# Patient Record
Sex: Female | Born: 1970
Health system: Southern US, Community
[De-identification: ages and names within clinical notes are randomized; demographics above are authoritative.]

## PROBLEM LIST (undated history)

## (undated) DIAGNOSIS — N390 Urinary tract infection, site not specified: Secondary | ICD-10-CM

## (undated) DIAGNOSIS — Z90711 Acquired absence of uterus with remaining cervical stump: Secondary | ICD-10-CM

## (undated) DIAGNOSIS — F419 Anxiety disorder, unspecified: Secondary | ICD-10-CM

## (undated) DIAGNOSIS — F410 Panic disorder [episodic paroxysmal anxiety] without agoraphobia: Secondary | ICD-10-CM

## (undated) HISTORY — PX: ABDOMINAL HYSTERECTOMY: SHX81

---

## 2012-09-11 ENCOUNTER — Emergency Department (HOSPITAL_COMMUNITY): Payer: Self-pay

## 2012-09-11 ENCOUNTER — Encounter (HOSPITAL_COMMUNITY): Payer: Self-pay | Admitting: *Deleted

## 2012-09-11 ENCOUNTER — Emergency Department (HOSPITAL_COMMUNITY)
Admission: EM | Admit: 2012-09-11 | Discharge: 2012-09-11 | Disposition: A | Discharge status: Home / Self Care | Payer: Self-pay | Attending: Emergency Medicine | Admitting: Emergency Medicine

## 2012-09-11 DIAGNOSIS — R000 Tachycardia, unspecified: Secondary | ICD-10-CM | POA: Insufficient documentation

## 2012-09-11 DIAGNOSIS — F411 Generalized anxiety disorder: Secondary | ICD-10-CM | POA: Insufficient documentation

## 2012-09-11 DIAGNOSIS — R110 Nausea: Secondary | ICD-10-CM | POA: Insufficient documentation

## 2012-09-11 DIAGNOSIS — F17200 Nicotine dependence, unspecified, uncomplicated: Secondary | ICD-10-CM | POA: Insufficient documentation

## 2012-09-11 DIAGNOSIS — R0602 Shortness of breath: Secondary | ICD-10-CM | POA: Insufficient documentation

## 2012-09-11 DIAGNOSIS — Z87440 Personal history of urinary (tract) infections: Secondary | ICD-10-CM | POA: Insufficient documentation

## 2012-09-11 DIAGNOSIS — F419 Anxiety disorder, unspecified: Secondary | ICD-10-CM

## 2012-09-11 DIAGNOSIS — R002 Palpitations: Secondary | ICD-10-CM | POA: Insufficient documentation

## 2012-09-11 HISTORY — DX: Urinary tract infection, site not specified: N39.0

## 2012-09-11 LAB — CBC
MCH: 30.2 pg (ref 26.0–34.0)
MCHC: 34.5 g/dL (ref 30.0–36.0)
MCV: 87.5 fL (ref 78.0–100.0)
Platelets: 242 10*3/uL (ref 150–400)
RDW: 13.4 % (ref 11.5–15.5)

## 2012-09-11 LAB — PRO B NATRIURETIC PEPTIDE: Pro B Natriuretic peptide (BNP): 108.2 pg/mL (ref 0–125)

## 2012-09-11 LAB — BASIC METABOLIC PANEL
CO2: 27 mEq/L (ref 19–32)
Calcium: 10 mg/dL (ref 8.4–10.5)
Creatinine, Ser: 0.7 mg/dL (ref 0.50–1.10)
Glucose, Bld: 113 mg/dL — ABNORMAL HIGH (ref 70–99)

## 2012-09-11 MED ORDER — LORAZEPAM 2 MG/ML IJ SOLN
1.0000 mg | INTRAMUSCULAR | Status: DC | PRN
  Administered 2012-09-11: 1 mg via INTRAVENOUS
  Filled 2012-09-11: qty 1

## 2012-09-11 MED ORDER — LORAZEPAM 1 MG PO TABS: 1.0000 mg | ORAL_TABLET | Freq: Three times a day (TID) | ORAL | Status: AC | PRN

## 2012-09-11 MED ORDER — ONDANSETRON HCL 4 MG/2ML IJ SOLN
4.0000 mg | Freq: Once | INTRAMUSCULAR | Status: AC
  Administered 2012-09-11: 4 mg via INTRAVENOUS
  Filled 2012-09-11: qty 2

## 2012-09-11 NOTE — ED Provider Notes (Signed)
CSN: [id number]     Arrival date & time 09/11/12  1231 History     First MD Initiated Contact with Patient 09/11/12 1337     Chief Complaint  Patient presents with  . Chest Pain    HPI   Judith Wu as a tightness in her chest and "like my heart skin or jump out". She states that she lost her mother-in-law to cancer in June. Shuntogram baby born in July. Last week her mother was diagnosed with end-stage lung cancer and is moved in with her. She states her husband all of his siblings are "at each other's throats."  Other stitches under a lot of stress. She looks anxious. She states she feels anxious. She states today she felt nausea she felt tight she felt her heart going fast she felt that it was time to get looked at. No history of cancer no history of DVT she is a smoker history of PE no leg swelling no immobilization casts or fractures surgeries status post hysterectomy not on external hormones no hemoptysis or cough. She is sleeping she is has been eating a little bit less until today states his nausea did not eaten today Past Medical History  Diagnosis Date  . UTI (urinary tract infection)    History reviewed. No pertinent past surgical history. History reviewed. No pertinent family history. History  Substance Use Topics  . Smoking status: Current Every Day Smoker    Types: Cigarettes  . Smokeless tobacco: Not on file  . Alcohol Use: No   OB History   Grav Para Term Preterm Abortions TAB SAB Ect Mult Living                 Review of Systems  Constitutional: Negative for fever, chills, diaphoresis, appetite change and fatigue.  HENT: Negative for sore throat, mouth sores and trouble swallowing.   Eyes: Negative for visual disturbance.  Respiratory: Positive for shortness of breath. Negative for cough, chest tightness and wheezing.   Cardiovascular: Positive for palpitations. Negative for chest pain and leg swelling.  Gastrointestinal: Negative for nausea, vomiting, abdominal  pain, diarrhea and abdominal distention.  Endocrine: Negative for polydipsia, polyphagia and polyuria.  Genitourinary: Negative for dysuria, frequency and hematuria.  Musculoskeletal: Negative for gait problem.  Skin: Negative for color change, pallor and rash.  Neurological: Negative for dizziness, syncope, light-headedness and headaches.  Hematological: Does not bruise/bleed easily.  Psychiatric/Behavioral: Negative for behavioral problems and confusion. The patient is nervous/anxious.     Allergies  Sulfa antibiotics  Home Medications   Current Outpatient Rx  Name  Route  Sig  Dispense  Refill  . ibuprofen (ADVIL,MOTRIN) 200 MG tablet   Oral   Take 600 mg by mouth every 6 (six) hours as needed for pain.         . nitrofurantoin (MACRODANTIN) 50 MG capsule   Oral   Take 50 mg by mouth every other day.         Marland Kitchen LORazepam (ATIVAN) 1 MG tablet   Oral   Take 1 tablet (1 mg total) by mouth 3 (three) times daily as needed for anxiety.   15 tablet   0    BP 120/81  Pulse 72  Temp(Src) 98.6 F (37 C) (Oral)  Resp 17  SpO2 98% Physical Exam  Constitutional: She is oriented to person, place, and time. She appears well-developed and well-nourished. No distress.  Appears anxious  HENT:  Head: Normocephalic.  Eyes: Conjunctivae are normal. Pupils are equal, round,  and reactive to light. No scleral icterus.  Neck: Normal range of motion. Neck supple. No thyromegaly present.  No thyromegaly.  Cardiovascular: Normal rate and regular rhythm.  Exam reveals no gallop and no friction rub.   No murmur heard. Tachycardic arrest no murmurs rubs gallops  Pulmonary/Chest: Effort normal and breath sounds normal. No respiratory distress. She has no wheezes. She has no rales.  Normal pulmonary exam  Abdominal: Soft. Bowel sounds are normal. She exhibits no distension. There is no tenderness. There is no rebound.  Musculoskeletal: Normal range of motion.  Neurological: She is alert and  oriented to person, place, and time.  Skin: Skin is warm and dry. No rash noted.  Psychiatric: She has a normal mood and affect. Her behavior is normal.  Appears anxious but otherwise in    ED Course  EKG: Indication for chest pain findings sinus tachycardia rate 1:15 degree T waves inferiorly no ST changes no ectopy  Procedures (including critical care time)  Labs Reviewed  BASIC METABOLIC PANEL - Abnormal; Notable for the following:    Potassium 2.9 (*)    Glucose, Bld 113 (*)    BUN 4 (*)    All other components within normal limits  CBC - Abnormal; Notable for the following:    Hemoglobin 15.2 (*)    All other components within normal limits  PRO B NATRIURETIC PEPTIDE  D-DIMER, QUANTITATIVE  TSH  POCT I-STAT TROPONIN I  POCT I-STAT TROPONIN I   Dg Chest 1 View  09/11/2012   *RADIOLOGY REPORT*  Clinical Data: Chest pain  CHEST - 1 VIEW  Comparison:  Study obtained earlier in the day.  Findings:  Monitor leads have been removed.  There is underlying emphysema.  No nodular lesions are seen on this study.  There is no edema or consolidation.  The heart size is normal.  Pulmonary vascularity reflects underlying emphysema.  No adenopathy.  No bone lesions.  IMPRESSION: Underlying emphysema.  No mass.  No edema or consolidation.   Original Report Authenticated By: Bretta Bang, M.D.   Dg Chest 2 View  09/11/2012   *RADIOLOGY REPORT*  Clinical Data: Chest pain and shortness of breath  CHEST - 2 VIEW  Comparison: None.  Findings: Hyperinflation consistent with COPD.  Heart size and vascular pattern normal.  No infiltrate or effusion.  Rounded low attenuation opacity lateral left upper lobe may represent a snap on the patients gown. This could also be related to an EKG lead. There is a similar findings seen less obviously over the right upper lobe.  IMPRESSION: COPD. Low attenuation opacity over the left upper lobe laterally may relate to an EKG lead or snap on patients gown.  Similar  finding on the right.  Please correlate clinically and consider repeating the study if indicated.   Original Report Authenticated By: Esperanza Heir, M.D.   1. Anxiety   2. Palpitations     MDM  Initial conversation with her I have written initial radiology report. Your previous artifact is distention from EKG lead or her down. I discussed this with her. She was relieved here "additional cancer". Did tell her we look at this repeat chest x-ray to ensure there is no lesion. It's become quite evident to me that her big concern today was that she may have cancer because her mother's recent diagnosis in her symptoms a smoking history. I made her aware of the abnormal parts of her chest x-ray would suggest early COPD. We are repeating her study  now. We'll obtain a d-dimer she is tachycardic and does smoke otherwise she has low well's criteria for pulmonary embolism she is not hypoxemic.  She's feeling much improved after the above. Heart in the 80s. 97% on room air. Initial and follow troponin 3 hours are normal. D-dimer is normal. Chest x-ray shows resolution of the abnormalities after her EKG leads and down removed. She is early COPD changes. She is made aware of these and asked in no uncertain terms to stop smoking.  Claudean Kinds, MD 09/11/12 [phone]

## 2012-09-11 NOTE — ED Notes (Signed)
Patient transported to X-ray 

## 2012-09-11 NOTE — ED Notes (Signed)
Reports having cp, feels like a tight band around her chest. Also having palpitations, sob and nausea. ekg done at triage, HR 115

## 2012-09-11 NOTE — ED Notes (Signed)
Blood drawn from new IV site for add on labs

## 2012-09-13 ENCOUNTER — Encounter (HOSPITAL_COMMUNITY): Payer: Self-pay | Admitting: Nurse Practitioner

## 2012-09-13 ENCOUNTER — Emergency Department (HOSPITAL_COMMUNITY)
Admission: EM | Admit: 2012-09-13 | Discharge: 2012-09-13 | Disposition: A | Discharge status: Home / Self Care | Payer: Self-pay | Attending: Emergency Medicine | Admitting: Emergency Medicine

## 2012-09-13 DIAGNOSIS — Z87440 Personal history of urinary (tract) infections: Secondary | ICD-10-CM | POA: Insufficient documentation

## 2012-09-13 DIAGNOSIS — Z90710 Acquired absence of both cervix and uterus: Secondary | ICD-10-CM | POA: Insufficient documentation

## 2012-09-13 DIAGNOSIS — R05 Cough: Secondary | ICD-10-CM | POA: Insufficient documentation

## 2012-09-13 DIAGNOSIS — R450 Nervousness: Secondary | ICD-10-CM | POA: Insufficient documentation

## 2012-09-13 DIAGNOSIS — R059 Cough, unspecified: Secondary | ICD-10-CM | POA: Insufficient documentation

## 2012-09-13 DIAGNOSIS — F17200 Nicotine dependence, unspecified, uncomplicated: Secondary | ICD-10-CM | POA: Insufficient documentation

## 2012-09-13 DIAGNOSIS — Z79899 Other long term (current) drug therapy: Secondary | ICD-10-CM | POA: Insufficient documentation

## 2012-09-13 DIAGNOSIS — Z3202 Encounter for pregnancy test, result negative: Secondary | ICD-10-CM | POA: Insufficient documentation

## 2012-09-13 DIAGNOSIS — R112 Nausea with vomiting, unspecified: Secondary | ICD-10-CM | POA: Insufficient documentation

## 2012-09-13 HISTORY — DX: Anxiety disorder, unspecified: F41.9

## 2012-09-13 HISTORY — DX: Acquired absence of uterus with remaining cervical stump: Z90.711

## 2012-09-13 LAB — CBC WITH DIFFERENTIAL/PLATELET
Eosinophils Relative: 0 % (ref 0–5)
HCT: 40.6 % (ref 36.0–46.0)
Lymphocytes Relative: 31 % (ref 12–46)
Lymphs Abs: 1.9 10*3/uL (ref 0.7–4.0)
MCV: 87.9 fL (ref 78.0–100.0)
Monocytes Absolute: 0.3 10*3/uL (ref 0.1–1.0)
Platelets: 204 10*3/uL (ref 150–400)
RBC: 4.62 MIL/uL (ref 3.87–5.11)
WBC: 6.3 10*3/uL (ref 4.0–10.5)

## 2012-09-13 LAB — URINALYSIS, ROUTINE W REFLEX MICROSCOPIC
Ketones, ur: 15 mg/dL — AB
Protein, ur: NEGATIVE mg/dL
Urobilinogen, UA: 1 mg/dL (ref 0.0–1.0)

## 2012-09-13 LAB — COMPREHENSIVE METABOLIC PANEL
ALT: 10 U/L (ref 0–35)
CO2: 30 mEq/L (ref 19–32)
Calcium: 9.5 mg/dL (ref 8.4–10.5)
GFR calc Af Amer: >90 mL/min (ref >90)
GFR calc non Af Amer: >90 mL/min (ref >90)
Glucose, Bld: 106 mg/dL — ABNORMAL HIGH (ref 70–99)
Sodium: 141 mEq/L (ref 135–145)
Total Bilirubin: 0.6 mg/dL (ref 0.3–1.2)

## 2012-09-13 LAB — URINE MICROSCOPIC-ADD ON

## 2012-09-13 LAB — POCT PREGNANCY, URINE: Preg Test, Ur: NEGATIVE

## 2012-09-13 MED ORDER — ONDANSETRON 4 MG PO TBDP
8.0000 mg | ORAL_TABLET | Freq: Once | ORAL | Status: AC
  Administered 2012-09-13: 8 mg via ORAL
  Filled 2012-09-13: qty 2

## 2012-09-13 MED ORDER — ONDANSETRON HCL 4 MG PO TABS: 4.0000 mg | ORAL_TABLET | Freq: Three times a day (TID) | ORAL | Status: DC | PRN

## 2012-09-13 MED ORDER — POTASSIUM CHLORIDE CRYS ER 20 MEQ PO TBCR
40.0000 meq | EXTENDED_RELEASE_TABLET | Freq: Once | ORAL | Status: AC
  Administered 2012-09-13: 40 meq via ORAL
  Filled 2012-09-13: qty 2

## 2012-09-13 NOTE — ED Provider Notes (Signed)
Medical screening examination/treatment/procedure(s) were performed by non-physician practitioner and as supervising physician I was immediately available for consultation/collaboration.  Flint Melter, MD 09/13/12 [phone]

## 2012-09-13 NOTE — ED Provider Notes (Signed)
CSN: [id number]     Arrival date & time 09/13/12  1003 History     First MD Initiated Contact with Patient 09/13/12 1027     Chief Complaint  Patient presents with  . Anxiety   (Consider location/radiation/quality/duration/timing/severity/associated sxs/prior Treatment) HPI Comments: Pt p/w nausea and vomiting.  States she has been nauseated for months, every day, and has vomited the past two days.  Emesis is contents of her stomach only.  Denies fevers, abdominal pain, change in bowel habits.  Denies urinary symptoms.  Denies abnormal vaginal bleeding or discharge.  Pt is s/p hysterectomy.  Pt has an unchanged daily smoker's cough.  No CP or SOB.  Pt has been seen recently for anxiety and still feels anxious, states the ativan is not helping.   Patient is a 42 y.o. female presenting with anxiety. The history is provided by the patient.  Anxiety Associated symptoms include coughing, nausea and vomiting. Pertinent negatives include no abdominal pain, chest pain, chills or fever.    Past Medical History  Diagnosis Date  . UTI (urinary tract infection)   . Anxiety    Past Surgical History  Procedure Laterality Date  . Abdominal hysterectomy     History reviewed. No pertinent family history. History  Substance Use Topics  . Smoking status: Current Every Day Smoker    Types: Cigarettes  . Smokeless tobacco: Not on file  . Alcohol Use: No   OB History   Grav Para Term Preterm Abortions TAB SAB Ect Mult Living                 Review of Systems  Constitutional: Negative for fever and chills.  Respiratory: Positive for cough. Negative for shortness of breath.   Cardiovascular: Negative for chest pain.  Gastrointestinal: Positive for nausea and vomiting. Negative for abdominal pain and diarrhea.  Genitourinary: Negative for dysuria, urgency, frequency, vaginal bleeding and vaginal discharge.  Psychiatric/Behavioral: The patient is nervous/anxious.     Allergies  Sulfa  antibiotics  Home Medications   Current Outpatient Rx  Name  Route  Sig  Dispense  Refill  . ibuprofen (ADVIL,MOTRIN) 200 MG tablet   Oral   Take 600 mg by mouth every 6 (six) hours as needed for pain.         Marland Kitchen LORazepam (ATIVAN) 1 MG tablet   Oral   Take 1 tablet (1 mg total) by mouth 3 (three) times daily as needed for anxiety.   15 tablet   0   . nitrofurantoin (MACRODANTIN) 50 MG capsule   Oral   Take 50 mg by mouth every other day.          BP 126/84  Pulse 106  Temp(Src) 98.3 F (36.8 C) (Oral)  Resp 15  Ht 5\' 3"  (1.6 m)  Wt 105 lb (47.628 kg)  BMI 18.6 kg/m2  SpO2 96% Physical Exam  Nursing note and vitals reviewed. Constitutional: She appears well-developed and well-nourished. No distress.  HENT:  Head: Normocephalic and atraumatic.  Neck: Neck supple.  Cardiovascular: Normal rate and regular rhythm.   Pulmonary/Chest: Effort normal and breath sounds normal. No respiratory distress. She has no wheezes. She has no rales.  Abdominal: Soft. She exhibits no distension. There is no tenderness. There is no rebound and no guarding.  Neurological: She is alert.  Skin: She is not diaphoretic.    ED Course   Procedures (including critical care time)  Labs Reviewed  COMPREHENSIVE METABOLIC PANEL - Abnormal; Notable for the following:  Potassium 3.0 (*)    Glucose, Bld 106 (*)    All other components within normal limits  URINALYSIS, ROUTINE W REFLEX MICROSCOPIC - Abnormal; Notable for the following:    Color, Urine AMBER (*)    APPearance CLOUDY (*)    Hgb urine dipstick SMALL (*)    Bilirubin Urine SMALL (*)    Ketones, ur 15 (*)    Leukocytes, UA TRACE (*)    All other components within normal limits  URINE MICROSCOPIC-ADD ON - Abnormal; Notable for the following:    Squamous Epithelial / LPF FEW (*)    Bacteria, UA FEW (*)    All other components within normal limits  CBC WITH DIFFERENTIAL  LIPASE, BLOOD  POCT PREGNANCY, URINE   Dg Chest 1  View  09/11/2012   *RADIOLOGY REPORT*  Clinical Data: Chest pain  CHEST - 1 VIEW  Comparison:  Study obtained earlier in the day.  Findings:  Monitor leads have been removed.  There is underlying emphysema.  No nodular lesions are seen on this study.  There is no edema or consolidation.  The heart size is normal.  Pulmonary vascularity reflects underlying emphysema.  No adenopathy.  No bone lesions.  IMPRESSION: Underlying emphysema.  No mass.  No edema or consolidation.   Original Report Authenticated By: Bretta Bang, M.D.   Dg Chest 2 View  09/11/2012   *RADIOLOGY REPORT*  Clinical Data: Chest pain and shortness of breath  CHEST - 2 VIEW  Comparison: None.  Findings: Hyperinflation consistent with COPD.  Heart size and vascular pattern normal.  No infiltrate or effusion.  Rounded low attenuation opacity lateral left upper lobe may represent a snap on the patients gown. This could also be related to an EKG lead. There is a similar findings seen less obviously over the right upper lobe.  IMPRESSION: COPD. Low attenuation opacity over the left upper lobe laterally may relate to an EKG lead or snap on patients gown.  Similar finding on the right.  Please correlate clinically and consider repeating the study if indicated.   Original Report Authenticated By: Esperanza Heir, M.D.   Filed Vitals:   09/13/12 1314  BP: 115/82  Pulse: 80  Temp:   Resp: 18      1. Nausea and vomiting     MDM  Pt with months of nausea, vomiting x 2 days without abdominal pain.  Pt has significant anxiety currently. Pt does have PCP she can follow up with.  No CP, SOB.  No fevers.  Abdominal exam is unremarkable.  Labs unremarkable.  No vomiting in ED. Pt given zofran in ED and has been tolerating PO fluids.  D/C home with PCP follow up, zofran for nausea.  Discussed all results with patient.  Pt given return precautions.  Pt verbalizes understanding and agrees with plan.      Trixie Dredge, PA-C 09/13/12 1459

## 2012-09-13 NOTE — ED Notes (Signed)
I gave the patient a cup of ice and a ginger-ale. 

## 2012-09-13 NOTE — ED Notes (Signed)
Pt was seen here 2 days ago and diagnosed with anxiety, given Rx for ativan po. Took ativan yesterday for the anxiety "and it made me feel wobbly and nauseated, i couldn't do anything because i felt so bad after i took the medicine." A&Ox4, resp e/u

## 2015-06-11 ENCOUNTER — Encounter (HOSPITAL_COMMUNITY): Payer: Self-pay | Admitting: Emergency Medicine

## 2015-06-11 ENCOUNTER — Emergency Department (HOSPITAL_COMMUNITY): Payer: Self-pay

## 2015-06-11 ENCOUNTER — Emergency Department (HOSPITAL_COMMUNITY)
Admission: EM | Admit: 2015-06-11 | Discharge: 2015-06-11 | Disposition: A | Discharge status: Home / Self Care | Payer: Self-pay | Attending: Emergency Medicine | Admitting: Emergency Medicine

## 2015-06-11 DIAGNOSIS — R5383 Other fatigue: Secondary | ICD-10-CM | POA: Insufficient documentation

## 2015-06-11 DIAGNOSIS — R630 Anorexia: Secondary | ICD-10-CM | POA: Insufficient documentation

## 2015-06-11 DIAGNOSIS — F17210 Nicotine dependence, cigarettes, uncomplicated: Secondary | ICD-10-CM | POA: Insufficient documentation

## 2015-06-11 DIAGNOSIS — E876 Hypokalemia: Secondary | ICD-10-CM | POA: Insufficient documentation

## 2015-06-11 DIAGNOSIS — M549 Dorsalgia, unspecified: Secondary | ICD-10-CM | POA: Insufficient documentation

## 2015-06-11 DIAGNOSIS — Z90710 Acquired absence of both cervix and uterus: Secondary | ICD-10-CM | POA: Insufficient documentation

## 2015-06-11 DIAGNOSIS — M25511 Pain in right shoulder: Secondary | ICD-10-CM | POA: Insufficient documentation

## 2015-06-11 DIAGNOSIS — F410 Panic disorder [episodic paroxysmal anxiety] without agoraphobia: Secondary | ICD-10-CM | POA: Insufficient documentation

## 2015-06-11 DIAGNOSIS — Z87440 Personal history of urinary (tract) infections: Secondary | ICD-10-CM | POA: Insufficient documentation

## 2015-06-11 HISTORY — DX: Panic disorder (episodic paroxysmal anxiety): F41.0

## 2015-06-11 LAB — I-STAT CHEM 8, ED
BUN: 4 mg/dL — AB (ref 6–20)
CALCIUM ION: 1.11 mmol/L — AB (ref 1.12–1.23)
CHLORIDE: 100 mmol/L — AB (ref 101–111)
CREATININE: 0.6 mg/dL (ref 0.44–1.00)
GLUCOSE: 93 mg/dL (ref 65–99)
HCT: 44 % (ref 36.0–46.0)
Hemoglobin: 15 g/dL (ref 12.0–15.0)
Potassium: 2.6 mmol/L — CL (ref 3.5–5.1)
Sodium: 143 mmol/L (ref 135–145)
TCO2: 26 mmol/L (ref 0–100)

## 2015-06-11 MED ORDER — POTASSIUM CHLORIDE CRYS ER 20 MEQ PO TBCR
40.0000 meq | EXTENDED_RELEASE_TABLET | Freq: Once | ORAL | Status: AC
  Administered 2015-06-11: 40 meq via ORAL
  Filled 2015-06-11: qty 2

## 2015-06-11 NOTE — Discharge Instructions (Signed)
It is important for you to follow-up with your primary care doctor or call the [facility] in order to establish primary care. You did have a slightly low potassium and you were given a potassium supplement in the emergency department. Your chest x-ray did not show any new masses or other concerning findings. It is important for you to stop smoking. Return to ED for any new or worsening symptoms.  Fatigue Fatigue is feeling tired all of the time, a lack of energy, or a lack of motivation. Occasional or mild fatigue is often a normal response to activity or life in general. However, long-lasting (chronic) or extreme fatigue may indicate an underlying medical condition. HOME CARE INSTRUCTIONS  Watch your fatigue for any changes. The following actions may help to lessen any discomfort you are feeling:  Talk to your health care provider about how much sleep you need each night. Try to get the required amount every night.  Take medicines only as directed by your health care provider.  Eat a healthy and nutritious diet. Ask your health care provider if you need help changing your diet.  Drink enough fluid to keep your urine clear or pale yellow.  Practice ways of relaxing, such as yoga, meditation, massage therapy, or acupuncture.  Exercise regularly.   Change situations that cause you stress. Try to keep your work and personal routine reasonable.  Do not abuse illegal drugs.  Limit alcohol intake to no more than 1 drink per day for nonpregnant women and 2 drinks per day for men. One drink equals 12 ounces of beer, 5 ounces of wine, or 1 ounces of hard liquor.  Take a multivitamin, if directed by your health care provider. SEEK MEDICAL CARE IF:   Your fatigue does not get better.  You have a fever.   You have unintentional weight loss or gain.  You have headaches.   You have difficulty:   Falling asleep.  Sleeping throughout the night.  You feel  angry, guilty, anxious, or sad.   You are unable to have a bowel movement (constipation).   You skin is dry.   Your legs or another part of your body is swollen.  SEEK IMMEDIATE MEDICAL CARE IF:   You feel confused.   Your vision is blurry.  You feel faint or pass out.   You have a severe headache.   You have severe abdominal, pelvic, or back pain.   You have chest pain, shortness of breath, or an irregular or fast heartbeat.   You are unable to urinate or you urinate less than normal.   You develop abnormal bleeding, such as bleeding from the rectum, vagina, nose, lungs, or nipples.  You vomit blood.   You have thoughts about harming yourself or committing suicide.   You are worried that you might harm someone else.    This information is not intended to replace advice given to you by your health care provider. Make sure you discuss any questions you have with your health care provider.   Document Released: 11/09/2006 Document Revised: 02/02/2014 Document Reviewed: 05/16/2013 Elsevier Interactive Patient Education [employer]2016 [employer] Inc[employer]

## 2015-06-11 NOTE — ED Notes (Signed)
Pt. reports poor appetite " can't eat" for 2 weeks  , generalized weakness/ fatigue and chronic mid back pain for 6 months denies fall or injury.

## 2015-06-11 NOTE — ED Notes (Signed)
PA Cartner made aware of patient concern for "tight band like feeling" around her upper back between shoulder blades as well as K of 2.6. No recommendations per PA at this time. Pt placed on cardiac monitor for hypokalemia.

## 2015-06-11 NOTE — ED Provider Notes (Signed)
CSN: [id number]     Arrival date & time 06/11/15  1191 History   First MD Initiated Contact with Patient 06/11/15 [phone]     Chief Complaint  Patient presents with  . Weakness  . Anorexia  . Back Pain     (Consider location/radiation/quality/duration/timing/severity/associated sxs/prior Treatment) HPI Judith Wu is a 45 y.o. female with a history of UTI, anxiety and panic attacks comes in for evaluation of multiple complaints including generalized weakness, back pain and loss of appetite. Patient reports her mother died roughly 6 weeks ago and since that time she has had increased fatigue and loss of appetite. States she is still eating and drinking, but doesn't feel like she is eating enough. She also reports ongoing right shoulder and back pain for the past 4 months. States she has tried Ativan as prescribed by her PCP for this problem but it is not helping. She has not followed up with her PCP for this problem. She denies any fevers, chills, chest pain or shortness of breath, abdominal pain, nausea or vomiting, Diaphoresis, urinary symptoms.    Past Medical History  Diagnosis Date  . UTI (urinary tract infection)   . Anxiety   . S/P partial hysterectomy   . Panic attack    Past Surgical History  Procedure Laterality Date  . Abdominal hysterectomy     No family history on file. Social History  Substance Use Topics  . Smoking status: Current Every Day Smoker    Types: Cigarettes  . Smokeless tobacco: None  . Alcohol Use: No   OB History    No data available     Review of Systems A 10 point review of systems was completed and was negative except for pertinent positives and negatives as mentioned in the history of present illness     Allergies  Sulfa antibiotics  Home Medications   Prior to Admission medications   Medication Sig Start Date End Date Taking? Authorizing Provider  ibuprofen (ADVIL,MOTRIN) 200 MG tablet Take 600 mg by mouth every 6 (six) hours as  needed for pain.   Yes Historical Provider, MD  LORazepam (ATIVAN) 1 MG tablet Take 1 tablet (1 mg total) by mouth 3 (three) times daily as needed for anxiety. 09/11/12  Yes Rolland Porter, MD  nitrofurantoin (MACRODANTIN) 50 MG capsule Take 50 mg by mouth every other day.   Yes Historical Provider, MD  ondansetron (ZOFRAN) 4 MG tablet Take 1 tablet (4 mg total) by mouth every 8 (eight) hours as needed for nausea. 09/13/12   Trixie Dredge, PA-C   BP 112/81 mmHg  Pulse 90  Temp(Src) 97.6 F (36.4 C) (Oral)  Resp 20  Ht  (1.6 m)  Wt 48.535 kg  BMI 18.96 kg/m2  SpO2 99% Physical Exam  Constitutional: She is oriented to person, place, and time. She appears well-developed and well-nourished.  HENT:  Head: Normocephalic and atraumatic.  Mouth/Throat: Oropharynx is clear and moist.  Eyes: Conjunctivae are normal. Pupils are equal, round, and reactive to light. Right eye exhibits no discharge. Left eye exhibits no discharge. No scleral icterus.  Neck: Normal range of motion. Neck supple.  Cardiovascular: Normal rate, regular rhythm and normal heart sounds.   Pulmonary/Chest: Effort normal and breath sounds normal. No respiratory distress. She has no wheezes. She has no rales.  Abdominal: Soft. There is no tenderness.  Musculoskeletal: She exhibits no tenderness.  Full active range of motion.  Neurological: She is alert and oriented to person, place, and time.  No cranial nerve deficit. Coordination normal.  Cranial Nerves II-XII grossly intact. Motor strength is 5/5 in all 4 extremities. Sensation intact to light touch. Moves all extremities without ataxia. Gait is baseline.  Skin: Skin is warm and dry. No rash noted.  Psychiatric: She has a normal mood and affect.  Nursing note and vitals reviewed.   ED Course  Procedures (including critical care time) Labs Review Labs Reviewed  I-STAT CHEM 8, ED - Abnormal; Notable for the following:    Potassium 2.6 (*)    Chloride 100 (*)    BUN 4 (*)     Calcium, Ion 1.11 (*)    All other components within normal limits    Imaging Review Dg Chest 2 View  06/11/2015  CLINICAL DATA:  Weakness, back pain, decreased appetite for 3 weeks EXAM: CHEST  2 VIEW COMPARISON:  09/11/2012 FINDINGS: Cardiomediastinal silhouette is stable. Mild hyperinflation. Mild degenerative changes mid thoracic spine. Mild lower thoracic levoscoliosis. IMPRESSION: No active disease. Hyperinflation again noted. Mild degenerative changes thoracic spine. Mild lower thoracic levoscoliosis. Electronically Signed   By: Natasha MeadLiviu  Pop M.D.   On: 06/11/2015 08:12   I have personally reviewed and evaluated these images and lab results as part of my medical decision-making.   EKG Interpretation None     Filed Vitals:   06/11/15 0625 06/11/15 0700 06/11/15 0749  BP: 124/95 112/73 112/81  Pulse: 98 68 90  Temp: 97.6 F (36.4 C)    TempSrc: Oral    Resp: 16  20  Height: 5\' 3"  (1.6 m)    Weight: 48.535 kg    SpO2: 99% 99% 99%    MDM  Judith Wu is a 45 y.o. female who comes in for ongoing fatigue over the past 6 months, chronic back pain. On arrival, patient is hemodynamically stable and afebrile. She overall appears well, nontoxic. Physical exam is grossly unremarkable. We will obtain screening labs to ensure no electrolyte abnormality, anemia. Anticipate discharge and patient may follow-up with PCP.  Patient with hypokalemia at 2.6. It appears that this is baseline for patient and that she maintains a low K. However, given oral repletion in the emergency department. Encouraged aggressive oral rehydration at home with electrolyte balanced solution such as Gatorade. No evidence of any other acute or emergent pathology that requires immediate intervention.  Upon discharge, patient becomes tearful, Patient also concerned that her shoulder pain could be due to lung cancer as this happened to her mother. States she's had 10 pound weight loss over the past 3 weeks. Pending  chest x-ray.  Chest x-ray shows no active disease. Discussed smoking cessation at length. [facility] in order to establish PCP follow-up. She verbalizes understanding and agrees with this plan as well as subsequent discharge.  Final diagnoses:  Other fatigue  Hypokalemia        Joycie PeekBenjamin Lizandra Zakrzewski, PA-C 06/11/15 [id number]  Tomasita CrumbleAdeleke Oni, MD 06/11/15 1256

## 2015-06-11 NOTE — ED Notes (Signed)
Pt c/o weakness, back pain and decreased appetite x 3 weeks. Reports having similar episode in the past in which PCP gave ativan for panic attacks. Pt's symptoms unrelieved with medications at home

## 2015-06-18 ENCOUNTER — Ambulatory Visit: Payer: Self-pay | Attending: Internal Medicine | Admitting: Internal Medicine

## 2015-06-18 ENCOUNTER — Encounter: Payer: Self-pay | Admitting: Internal Medicine

## 2015-06-18 VITALS — BP 98/73 | HR 97 | Temp 98.3°F | Wt 107.0 lb

## 2015-06-18 DIAGNOSIS — Z791 Long term (current) use of non-steroidal anti-inflammatories (NSAID): Secondary | ICD-10-CM | POA: Insufficient documentation

## 2015-06-18 DIAGNOSIS — Z79899 Other long term (current) drug therapy: Secondary | ICD-10-CM | POA: Insufficient documentation

## 2015-06-18 DIAGNOSIS — Z90711 Acquired absence of uterus with remaining cervical stump: Secondary | ICD-10-CM | POA: Insufficient documentation

## 2015-06-18 DIAGNOSIS — Z720 Tobacco use: Secondary | ICD-10-CM

## 2015-06-18 DIAGNOSIS — N390 Urinary tract infection, site not specified: Secondary | ICD-10-CM | POA: Insufficient documentation

## 2015-06-18 DIAGNOSIS — F411 Generalized anxiety disorder: Secondary | ICD-10-CM

## 2015-06-18 DIAGNOSIS — F17210 Nicotine dependence, cigarettes, uncomplicated: Secondary | ICD-10-CM | POA: Insufficient documentation

## 2015-06-18 DIAGNOSIS — E876 Hypokalemia: Secondary | ICD-10-CM

## 2015-06-18 DIAGNOSIS — E46 Unspecified protein-calorie malnutrition: Secondary | ICD-10-CM

## 2015-06-18 DIAGNOSIS — M549 Dorsalgia, unspecified: Secondary | ICD-10-CM | POA: Insufficient documentation

## 2015-06-18 DIAGNOSIS — F32A Depression, unspecified: Secondary | ICD-10-CM

## 2015-06-18 DIAGNOSIS — F410 Panic disorder [episodic paroxysmal anxiety] without agoraphobia: Secondary | ICD-10-CM | POA: Insufficient documentation

## 2015-06-18 DIAGNOSIS — F329 Major depressive disorder, single episode, unspecified: Secondary | ICD-10-CM

## 2015-06-18 DIAGNOSIS — R630 Anorexia: Secondary | ICD-10-CM | POA: Insufficient documentation

## 2015-06-18 LAB — BASIC METABOLIC PANEL WITH GFR
BUN: 6 mg/dL — ABNORMAL LOW (ref 7–25)
CHLORIDE: 101 mmol/L (ref 98–110)
CO2: 29 mmol/L (ref 20–31)
Calcium: 9.7 mg/dL (ref 8.6–10.2)
Creat: 0.66 mg/dL (ref 0.50–1.10)
GFR, Est African American: >89 mL/min (ref >=60)
GLUCOSE: 106 mg/dL — AB (ref 65–99)
Potassium: 3.6 mmol/L (ref 3.5–5.3)
SODIUM: 140 mmol/L (ref 135–146)

## 2015-06-18 LAB — CBC
HEMATOCRIT: 42.4 % (ref 35.0–45.0)
Hemoglobin: 14.2 g/dL (ref 11.7–15.5)
MCH: 30 pg (ref 27.0–33.0)
MCHC: 33.5 g/dL (ref 32.0–36.0)
MCV: 89.6 fL (ref 80.0–100.0)
MPV: 10 fL (ref 7.5–12.5)
Platelets: 261 10*3/uL (ref 140–400)
RBC: 4.73 MIL/uL (ref 3.80–5.10)
RDW: 13.7 % (ref 11.0–15.0)
WBC: 7.2 10*3/uL (ref 3.8–10.8)

## 2015-06-18 LAB — HEMOGLOBIN A1C
Hgb A1c MFr Bld: 5.5 % (ref <5.7)
MEAN PLASMA GLUCOSE: 111 mg/dL

## 2015-06-18 MED ORDER — ESCITALOPRAM OXALATE 20 MG PO TABS: 20.0000 mg | ORAL_TABLET | Freq: Every day | ORAL | Status: DC

## 2015-06-18 MED ORDER — ESCITALOPRAM OXALATE 20 MG PO TABS: 20.0000 mg | ORAL_TABLET | Freq: Every day | ORAL | Status: AC

## 2015-06-18 MED FILL — ?ESCITALOPRAM 20 MG TABLET: 20 | 30 days supply | Qty: 30 | Fill #0

## 2015-06-18 NOTE — Progress Notes (Signed)
Judith Wu, is a 45 y.o. female  ONG:[id number]:[id number]  GMW:[id number]  DOB - [birth year]  CC:  Chief Complaint  Patient presents with  . Follow-up    hypokalemia  . Depression    Mom passed x 6 wks ago       HPI: Judith CorporalCrystal Wu is a 45 y.o. female here today to establish medical care, w/ recent ER visit 5/16 for weakness/anorexia/back pain ongoing for 6months, dx w/ hypokalemia. Pt states she has not been eating well at all last fews months, concentration decreased as well, but sleeping ok. Very tearful and sad all day.  + recently separated from her husband of 17 years 3 weeks ago.   They have 3 children together, youngest is 46 y/o who lives w/ her.  Denies si/ha/avh.   She and her husband went through marriage counseling last week, but she does not know if it helped.  She thinks her husband is paranoid; he thinks she was cheating on him, which she denied.   Was seeing Dr Doristine CounterBurnett, who prescribes her ativan prn and macrobid for frequent hx of UTI.  Pt states she takes  Ativan sparingly.  She has never been placed on antidepresant prior, but very interested in anything that will help her.  She is currently drinking 1 Ensure daily as well as 1 gatorade to help w/ nutrition and electrolytes.  But appetite is very poor, admitted to eating pop tart for lunch.  Prior hx of frequent UTIs, for which doctors thought her uterus was pressing on her bladder and causing this, so she underwent Hysterectomy in 1990s.  However, she still states she is getting utis.  + Heavy tob 2ppd for many years, not ready to quit due to her current stressors. Mom passed away 3 years ago.  Dad passed away years ago.  Patient has No headache, No chest pain, No abdominal pain - No Nausea, No new weakness tingling or numbness, No Cough - SOB.  Allergies  Allergen Reactions  . Sulfa Antibiotics    Past Medical History  Diagnosis Date  . UTI (urinary tract infection)   . Anxiety   . S/P partial hysterectomy   .  Panic attack    Current Outpatient Prescriptions on File Prior to Visit  Medication Sig Dispense Refill  . ibuprofen (ADVIL,MOTRIN) 200 MG tablet Take 600 mg by mouth every 6 (six) hours as needed for pain.    Marland Kitchen. LORazepam (ATIVAN) 1 MG tablet Take 1 tablet (1 mg total) by mouth 3 (three) times daily as needed for anxiety. 15 tablet 0  . nitrofurantoin (MACRODANTIN) 50 MG capsule Take 50 mg by mouth every other day. Reported on 06/18/2015     No current facility-administered medications on file prior to visit.   No family history on file. Social History   Social History  . Marital Status: Married    Spouse Name: N/A  . Number of Children: N/A  . Years of Education: N/A   Occupational History  . Not on file.   Social History Main Topics  . Smoking status: Current Every Day Smoker    Types: Cigarettes  . Smokeless tobacco: Not on file  . Alcohol Use: No  . Drug Use: No  . Sexual Activity: Not on file   Other Topics Concern  . Not on file   Social History Narrative    Review of Systems: Constitutional: Negative for fever, chills, diaphoresis, activity change, + appetite change, decreased and fatigue/no energy x 6months;  HENT: Negative  for ear pain, nosebleeds, congestion, facial swelling, rhinorrhea, neck pain, neck stiffness and ear discharge.  Eyes: Negative for pain, discharge, redness, itching and visual disturbance. Respiratory: Negative for cough, choking, chest tightness, shortness of breath, wheezing and stridor.  Cardiovascular: Negative for chest pain, palpitations and leg swelling. Gastrointestinal: Negative for abdominal distention. Genitourinary: Negative for dysuria, urgency, frequency, hematuria, flank pain, decreased urine volume, difficulty urinating and dyspareunia.   +frequent UTIs Musculoskeletal: Negative for back pain, joint swelling, arthralgia and gait problem. Neurological: Negative for dizziness, tremors, seizures, syncope, facial asymmetry, speech  difficulty, weakness, light-headedness, numbness and headaches.  Hematological: Negative for adenopathy. Does not bruise/bleed easily. Psychiatric/Behavioral: Negative for hallucinations, behavioral problems, confusion, and agitation.    + dysphoric mood, decreased concentration, very depressed/anxious/ tearful all the time, no si/hi/avh   Objective:   Filed Vitals:   06/18/15 1458  BP: 98/73  Pulse: 97  Temp: 98.3 F (36.8 C)    Filed Weights   06/18/15 1458  Weight: 107 lb (48.535 kg)    BP Readings from Last 3 Encounters:  06/18/15 98/73  06/11/15 109/78  09/13/12 115/82    Physical Exam: Constitutional: Patient appears well-developed and well-nourished. AAOx3, very thin appearing, tearful Caucasion female, smelling of tob. HENT: Normocephalic, atraumatic, External right and left ear normal. Oropharynx is clear and moist.  Eyes: Conjunctivae and EOM are normal. PERRL, no scleral icterus. Neck: Normal ROM. Neck supple. No JVD. No tracheal deviation. No thyromegaly. CVS: RRR, S1/S2 +, no murmurs, no gallops, no carotid bruit.  Pulmonary: Effort and breath sounds normal, no stridor, rhonchi, wheezes, rales.  Abdominal: Soft. BS +, no distension or tenderness. Musculoskeletal: Normal range of motion. LE: bilat/ no c/c/e, pulses 2+ bilateral. Neuro: Alert. muscle tone coordination. No cranial nerve deficit grossly. Skin: Skin is warm and dry. No rash noted. Not diaphoretic. No erythema. No pallor. Psychiatric:  Behavior, judgment, thought content normal.  +very tearful/sad/crying throughout exam.  Lab Results  Component Value Date   WBC 6.3 09/13/2012   HGB 15.0 06/11/2015   HCT 44.0 06/11/2015   MCV 87.9 09/13/2012   PLT 204 09/13/2012   Lab Results  Component Value Date   CREATININE 0.60 06/11/2015   BUN 4* 06/11/2015   NA 143 06/11/2015   K 2.6* 06/11/2015   CL 100* 06/11/2015   CO2 30 09/13/2012    No results found for: HGBA1C Lipid Panel  No results  found for: CHOL, TRIG, HDL, CHOLHDL, VLDL, LDLCALC     Depression screen [facility] 2/9 06/18/2015  Decreased Interest 3  Down, Depressed, Hopeless 3  PHQ - 2 Score 6  Altered sleeping 3  Tired, decreased energy 3  Change in appetite 3  Feeling bad or failure about yourself  3  Trouble concentrating 3  Moving slowly or fidgety/restless 3  Suicidal thoughts 0  PHQ-9 Score 24  Difficult doing work/chores Extremely dIfficult    Assessment and plan:   1. Hypokalemia Suspect due to malnutrition/anorexia Labs today Coupons for Boost given as well.  2. Anxiety state - no si/hi/avh - trial lexapro 20mg  po qday; per pt gets Ativan from Dr. Doristine Counter (sp?) - BASIC METABOLIC PANEL WITH GFR - CBC - chk tsh  3. Depression w/ anorexia - trial lexapro, f/u in 2 wks - amb ref psychiatry as well, but pt uninsured. - gave pt list of free Psychiatric clinics in area including [facility] to look into.  4. Tobacco abuse Recd tob cessation, but pt is not ready due to all above  stressors.  5. Malnutrition (HCC) - suspected - Prealbumin - Hemoglobin A1c  6. Fatigue May be mutifactorial, chk cbc r/o anemia chk tsh Emp trx of anxiety/depression w/ lexapro for now.  Return in about 2 weeks (around 07/02/2015) for anxiety ./deppresion.   The patient was given clear instructions to go to ER or return to medical center if symptoms don't improve, worsen or new problems develop. The patient verbalized understanding. The patient was told to call to get lab results if they haven't heard anything in the next week.      Pete Glatter, MD, MBA/MHA [facility] [place], [place] [phone]   06/18/2015, 4:05 PM

## 2015-06-18 NOTE — Patient Instructions (Signed)
° °Generalized Anxiety Disorder °Generalized anxiety disorder (GAD) is a mental disorder. It interferes with life functions, including relationships, work, and school. °GAD is different from normal anxiety, which everyone experiences at some point in their lives in response to specific life events and activities. Normal anxiety actually helps us prepare for and get through these life events and activities. Normal anxiety goes away after the event or activity is over.  °GAD causes anxiety that is not necessarily related to specific events or activities. It also causes excess anxiety in proportion to specific events or activities. The anxiety associated with GAD is also difficult to control. GAD can vary from mild to severe. People with severe GAD can have intense waves of anxiety with physical symptoms (panic attacks).  °SYMPTOMS °The anxiety and worry associated with GAD are difficult to control. This anxiety and worry are related to many life events and activities and also occur more days than not for 6 months or longer. People with GAD also have three or more of the following symptoms (one or more in children): °· Restlessness.   °· Fatigue. °· Difficulty concentrating.   °· Irritability. °· Muscle tension. °· Difficulty sleeping or unsatisfying sleep. °DIAGNOSIS °GAD is diagnosed through an assessment by your health care provider. Your health care provider will ask you questions about your mood, physical symptoms, and events in your life. Your health care provider may ask you about your medical history and use of alcohol or drugs, including prescription medicines. Your health care provider may also do a physical exam and blood tests. Certain medical conditions and the use of certain substances can cause symptoms similar to those associated with GAD. Your health care provider may refer you to a mental health specialist for further evaluation. °TREATMENT °The following therapies are usually used to treat GAD:   °· Medication. Antidepressant medication usually is prescribed for long-term daily control. Antianxiety medicines may be added in severe cases, especially when panic attacks occur.   °· Talk therapy (psychotherapy). Certain types of talk therapy can be helpful in treating GAD by providing support, education, and guidance. A form of talk therapy called cognitive behavioral therapy can teach you healthy ways to think about and react to daily life events and activities. °· Stress management techniques. These include yoga, meditation, and exercise and can be very helpful when they are practiced regularly. °A mental health specialist can help determine which treatment is best for you. Some people see improvement with one therapy. However, other people require a combination of therapies. °  °This information is not intended to replace advice given to you by your health care provider. Make sure you discuss any questions you have with your health care provider. °  °Document Released: 05/09/2012 Document Revised: 02/02/2014 Document Reviewed: 05/09/2012 °Elsevier Interactive Patient Education ©2016 Elsevier Inc. ° °Major Depressive Disorder °Major depressive disorder is a mental illness. It also may be called clinical depression or unipolar depression. Major depressive disorder usually causes feelings of sadness, hopelessness, or helplessness. Some people with this disorder do not feel particularly sad but lose interest in doing things they used to enjoy (anhedonia). Major depressive disorder also can cause physical symptoms. It can interfere with work, school, relationships, and other normal everyday activities. The disorder varies in severity but is longer lasting and more serious than the sadness we all feel from time to time in our lives. °Major depressive disorder often is triggered by stressful life events or major life changes. Examples of these triggers include divorce, loss of your job or home,   a move, and the  death of a family member or close friend. Sometimes this disorder occurs for no obvious reason at all. People who have family members with major depressive disorder or bipolar disorder are at higher risk for developing this disorder, with or without life stressors. Major depressive disorder can occur at any age. It may occur just once in your life (single episode major depressive disorder). It may occur multiple times (recurrent major depressive disorder). °SYMPTOMS °People with major depressive disorder have either anhedonia or depressed mood on nearly a daily basis for at least 2 weeks or longer. Symptoms of depressed mood include: °· Feelings of sadness (blue or down in the dumps) or emptiness. °· Feelings of hopelessness or helplessness. °· Tearfulness or episodes of crying (may be observed by others). °· Irritability (children and adolescents). °In addition to depressed mood or anhedonia or both, people with this disorder have at least four of the following symptoms: °· Difficulty sleeping or sleeping too much.   °· Significant change (increase or decrease) in appetite or weight.   °· Lack of energy or motivation. °· Feelings of guilt and worthlessness.   °· Difficulty concentrating, remembering, or making decisions. °· Unusually slow movement (psychomotor retardation) or restlessness (as observed by others).   °· Recurrent wishes for death, recurrent thoughts of self-harm (suicide), or a suicide attempt. °People with major depressive disorder commonly have persistent negative thoughts about themselves, other people, and the world. People with severe major depressive disorder may experience distorted beliefs or perceptions about the world (psychotic delusions). They also may see or hear things that are not real (psychotic hallucinations). °DIAGNOSIS °Major depressive disorder is diagnosed through an assessment by your health care provider. Your health care provider will ask about aspects of your daily life,  such as mood, sleep, and appetite, to see if you have the diagnostic symptoms of major depressive disorder. Your health care provider may ask about your medical history and use of alcohol or drugs, including prescription medicines. Your health care provider also may do a physical exam and blood work. This is because certain medical conditions and the use of certain substances can cause major depressive disorder-like symptoms (secondary depression). Your health care provider also may refer you to a mental health specialist for further evaluation and treatment. °TREATMENT °It is important to recognize the symptoms of major depressive disorder and seek treatment. The following treatments can be prescribed for this disorder:   °· Medicine. Antidepressant medicines usually are prescribed. Antidepressant medicines are thought to correct chemical imbalances in the brain that are commonly associated with major depressive disorder. Other types of medicine may be added if the symptoms do not respond to antidepressant medicines alone or if psychotic delusions or hallucinations occur. °· Talk therapy. Talk therapy can be helpful in treating major depressive disorder by providing support, education, and guidance. Certain types of talk therapy also can help with negative thinking (cognitive behavioral therapy) and with relationship issues that trigger this disorder (interpersonal therapy). °A mental health specialist can help determine which treatment is best for you. Most people with major depressive disorder do well with a combination of medicine and talk therapy. Treatments involving electrical stimulation of the brain can be used in situations with extremely severe symptoms or when medicine and talk therapy do not work over time. These treatments include electroconvulsive therapy, transcranial magnetic stimulation, and vagal nerve stimulation. °  °This information is not intended to replace advice given to you by your health  care provider. Make sure you discuss any questions you have   with your health care provider. °  °Document Released: 05/09/2012 Document Revised: 02/02/2014 Document Reviewed: 05/09/2012 °Elsevier Interactive Patient Education ©2016 Elsevier Inc. ° °

## 2015-06-19 ENCOUNTER — Telehealth: Payer: Self-pay | Admitting: Internal Medicine

## 2015-06-19 LAB — PREALBUMIN: Prealbumin: 33 mg/dL (ref 17–34)

## 2015-06-19 LAB — TSH: TSH: 1.58 mIU/L

## 2015-06-19 NOTE — Telephone Encounter (Signed)
Patient took lexapro last night and woke up shaking today..patient states her whole body was shaking....please follow up with patient

## 2015-06-19 NOTE — Telephone Encounter (Signed)
Called pt re: shakes w/ Lexapro.  Said she took the 20mg  dose last night, and ended up w/ diffuse shakes this am, now currently resolved.   Denies sz/los/pain/n/v.  I looked up adverse rx, and "shakes" not, but sz is.  She is very thin, perhaps 20mg  dose too high. Advised her to either stop it all together or cut the dose in half next time she takes it to see how she reacts to it.  She wants to try it Friday at the 1/2 dose to see how she does since she has not wk Saturday.  I cautioned her that if she has any concerns, to stop it immediately and call me / go to ER.

## 2016-03-19 ENCOUNTER — Emergency Department (HOSPITAL_COMMUNITY)
Admission: EM | Admit: 2016-03-19 | Discharge: 2016-03-19 | Disposition: A | Discharge status: Home / Self Care | Payer: Self-pay | Attending: Emergency Medicine | Admitting: Emergency Medicine

## 2016-03-19 ENCOUNTER — Encounter (HOSPITAL_COMMUNITY): Payer: Self-pay | Admitting: Emergency Medicine

## 2016-03-19 DIAGNOSIS — S80862A Insect bite (nonvenomous), left lower leg, initial encounter: Secondary | ICD-10-CM | POA: Insufficient documentation

## 2016-03-19 DIAGNOSIS — Y939 Activity, unspecified: Secondary | ICD-10-CM | POA: Insufficient documentation

## 2016-03-19 DIAGNOSIS — F17210 Nicotine dependence, cigarettes, uncomplicated: Secondary | ICD-10-CM | POA: Insufficient documentation

## 2016-03-19 DIAGNOSIS — Y929 Unspecified place or not applicable: Secondary | ICD-10-CM | POA: Insufficient documentation

## 2016-03-19 DIAGNOSIS — Y999 Unspecified external cause status: Secondary | ICD-10-CM | POA: Insufficient documentation

## 2016-03-19 DIAGNOSIS — Z79899 Other long term (current) drug therapy: Secondary | ICD-10-CM | POA: Insufficient documentation

## 2016-03-19 DIAGNOSIS — W57XXXA Bitten or stung by nonvenomous insect and other nonvenomous arthropods, initial encounter: Secondary | ICD-10-CM | POA: Insufficient documentation

## 2016-03-19 MED ORDER — DOXYCYCLINE HYCLATE 100 MG PO CAPS: 100.0000 mg | ORAL_CAPSULE | Freq: Two times a day (BID) | ORAL | 0 refills | Status: AC

## 2016-03-19 MED FILL — DOXYCYCLINE 100 MG TABLET: 100 | 14 days supply | Qty: 28 | Fill #0

## 2016-03-19 NOTE — ED Triage Notes (Addendum)
Pt presents to ED with tick bite to L calf.  Pt sts she has pain in her entire calf.  There is a 2 cm bullseye rash on back of L calf.  Pt sts she pulled a tiny black tick off last night but is not sure where she picked it up from.  4/10 pain.  Pt sts two coworkers recommended she get it looked at.

## 2016-03-19 NOTE — ED Provider Notes (Signed)
[facility]-EMERGENCY DEPT Provider Note   CSN: [id number] Arrival date & time: 03/19/16  4098     History   Chief Complaint Chief Complaint  Patient presents with  . Insect Bite    HPI Judith Wu is a 46 y.o. female.  Judith Wu is a 46 y.o. Female who presents to the ED complaining of a tick bite yesterday. Patient reports yesterday she noticed a tick to her left calf and she removed it with rubbing alcohol and her fingers. She reports noticing a small raised red area where the tick was located. She reports pain to the area. She denies any fevers, body aches, or other rashes. She has taken nothing for treatment today. She denies fevers, body aches, leg swelling, rashes, numbness, tingling, weakness, abdominal pain, nausea, vomiting or cough.    The history is provided by the patient. No language interpreter was used.    Past Medical History:  Diagnosis Date  . Anxiety   . Panic attack   . S/P partial hysterectomy   . UTI (urinary tract infection)     There are no active problems to display for this patient.   Past Surgical History:  Procedure Laterality Date  . ABDOMINAL HYSTERECTOMY      OB History    No data available       Home Medications    Prior to Admission medications   Medication Sig Start Date End Date Taking? Authorizing Provider  doxycycline (VIBRAMYCIN) 100 MG capsule Take 1 capsule (100 mg total) by mouth 2 (two) times daily. 03/19/16   Everlene Farrier, PA-C  escitalopram (LEXAPRO) 20 MG tablet Take 1 tablet (20 mg total) by mouth daily. 06/18/15   Pete Glatter, MD  ibuprofen (ADVIL,MOTRIN) 200 MG tablet Take 600 mg by mouth every 6 (six) hours as needed for pain.    Historical Provider, MD  LORazepam (ATIVAN) 1 MG tablet Take 1 tablet (1 mg total) by mouth 3 (three) times daily as needed for anxiety. 09/11/12   Rolland Porter, MD  nitrofurantoin (MACRODANTIN) 50 MG capsule Take 50 mg by mouth every other day. Reported on 06/18/2015    Historical  Provider, MD    Family History No family history on file.  Social History Social History  Substance Use Topics  . Smoking status: Current Every Day Smoker    Types: Cigarettes  . Smokeless tobacco: Not on file  . Alcohol use No     Allergies   Sulfa antibiotics   Review of Systems Review of Systems  Constitutional: Negative for chills and fever.  Respiratory: Negative for cough.   Gastrointestinal: Negative for abdominal pain, nausea and vomiting.  Musculoskeletal: Negative for arthralgias and myalgias.  Skin: Positive for wound. Negative for rash.  Neurological: Negative for weakness and numbness.     Physical Exam Updated Vital Signs BP 117/86 (BP Location: Right Arm)   Pulse 81   Temp 98.1 F (36.7 C) (Oral)   Resp 16   SpO2 97%   Physical Exam  Constitutional: She appears well-developed and well-nourished. No distress.  Nontoxic appearing.  HENT:  Head: Normocephalic and atraumatic.  Eyes: Right eye exhibits no discharge. Left eye exhibits no discharge.  Cardiovascular: Normal rate, regular rhythm and intact distal pulses.   Pulmonary/Chest: Effort normal. No respiratory distress.  Musculoskeletal: She exhibits no edema or tenderness.  No calf edema or TTP.   Neurological: She is alert. Coordination normal.  Skin: Skin is warm and dry. Capillary refill takes less than  2 seconds. No rash noted. She is not diaphoretic. No pallor.  1.5 cm raised erythematous lesion noted to her left posterior calf. No surrounding erythema or edema. No target lesions. No vesicles or bulla. No discharge or fluctuance.   Psychiatric: She has a normal mood and affect. Her behavior is normal.  Nursing note and vitals reviewed.    ED Treatments / Results  Labs (all labs ordered are listed, but only abnormal results are displayed) Labs Reviewed - No data to display  EKG  EKG Interpretation None       Radiology No results found.  Procedures Procedures (including  critical care time)  Medications Ordered in ED Medications - No data to display   Initial Impression / Assessment and Plan / ED Course  I have reviewed the triage vital signs and the nursing notes.  Pertinent labs & imaging results that were available during my care of the patient were reviewed by me and considered in my medical decision making (see chart for details).    Patient presents to the emergency room after pulling a tick off of her posterior calf yesterday. On exam she is afebrile nontoxic appearing. She is a 1.5 cm area of erythema and raised lesion to her posterior left calf. No surrounding erythema or edema. No target lesions. She denies any fevers or body aches. Will start the patient on doxycycline. Ibuprofen as needed for any pain. I discussed strict and specific return precautions. I advised the patient to follow-up with their primary care provider this week. I advised the patient to return to the emergency department with new or worsening symptoms or new concerns. The patient verbalized understanding and agreement with plan.     Final Clinical Impressions(s) / ED Diagnoses   Final diagnoses:  Tick bite, initial encounter    New Prescriptions Discharge Medication List as of 03/19/2016  7:25 AM    START taking these medications   Details  doxycycline (VIBRAMYCIN) 100 MG capsule Take 1 capsule (100 mg total) by mouth 2 (two) times daily., Starting Thu 03/19/2016, Print         Everlene FarrierWilliam Karis Emig, PA-C 03/19/16 [id number]    Gilda Creasehristopher J Pollina, MD 03/20/16 [phone]

## 2016-03-19 NOTE — ED Notes (Signed)
Pt states she understands instructions. Home stable with steady gait. 

## 2016-03-19 NOTE — ED Notes (Signed)
PA student in speaking with patient at this time.

## 2018-08-17 ENCOUNTER — Other Ambulatory Visit: Payer: Self-pay | Admitting: Physician Assistant

## 2018-08-17 DIAGNOSIS — Z1231 Encounter for screening mammogram for malignant neoplasm of breast: Secondary | ICD-10-CM

## 2018-08-31 ENCOUNTER — Ambulatory Visit: Payer: Self-pay

## 2020-06-21 ENCOUNTER — Other Ambulatory Visit: Payer: Self-pay | Admitting: Physician Assistant

## 2020-06-21 DIAGNOSIS — Z1231 Encounter for screening mammogram for malignant neoplasm of breast: Secondary | ICD-10-CM

## 2020-06-21 DIAGNOSIS — Z122 Encounter for screening for malignant neoplasm of respiratory organs: Secondary | ICD-10-CM

## 2020-07-08 ENCOUNTER — Ambulatory Visit
Admission: RE | Admit: 2020-07-08 | Discharge: 2020-07-08 | Disposition: A | Discharge status: Home / Self Care | Payer: Managed Care, Other (non HMO) | Source: Ambulatory Visit | Attending: Physician Assistant | Admitting: Physician Assistant

## 2020-07-08 DIAGNOSIS — Z122 Encounter for screening for malignant neoplasm of respiratory organs: Secondary | ICD-10-CM

## 2020-08-19 ENCOUNTER — Ambulatory Visit
Admission: RE | Admit: 2020-08-19 | Discharge: 2020-08-19 | Disposition: A | Discharge status: Home / Self Care | Payer: Managed Care, Other (non HMO) | Source: Ambulatory Visit | Attending: Physician Assistant | Admitting: Physician Assistant

## 2020-08-19 ENCOUNTER — Other Ambulatory Visit: Payer: Self-pay

## 2020-08-19 DIAGNOSIS — Z1231 Encounter for screening mammogram for malignant neoplasm of breast: Secondary | ICD-10-CM

## 2020-08-21 ENCOUNTER — Other Ambulatory Visit: Payer: Self-pay | Admitting: Physician Assistant

## 2020-08-21 DIAGNOSIS — R928 Other abnormal and inconclusive findings on diagnostic imaging of breast: Secondary | ICD-10-CM

## 2020-09-23 ENCOUNTER — Other Ambulatory Visit: Payer: Self-pay

## 2020-09-23 ENCOUNTER — Ambulatory Visit
Admission: RE | Admit: 2020-09-23 | Discharge: 2020-09-23 | Disposition: A | Discharge status: Home / Self Care | Payer: No Typology Code available for payment source | Source: Ambulatory Visit | Attending: Physician Assistant | Admitting: Physician Assistant

## 2020-09-23 DIAGNOSIS — R928 Other abnormal and inconclusive findings on diagnostic imaging of breast: Secondary | ICD-10-CM

## 2021-02-14 ENCOUNTER — Emergency Department (HOSPITAL_COMMUNITY)
Admission: EM | Admit: 2021-02-14 | Discharge: 2021-02-15 | Disposition: A | Discharge status: Home / Self Care | Payer: No Typology Code available for payment source | Attending: Emergency Medicine | Admitting: Emergency Medicine

## 2021-02-14 ENCOUNTER — Other Ambulatory Visit: Payer: Self-pay

## 2021-02-14 ENCOUNTER — Emergency Department (HOSPITAL_COMMUNITY): Payer: No Typology Code available for payment source

## 2021-02-14 ENCOUNTER — Encounter (HOSPITAL_COMMUNITY): Payer: Self-pay | Admitting: Emergency Medicine

## 2021-02-14 DIAGNOSIS — J449 Chronic obstructive pulmonary disease, unspecified: Secondary | ICD-10-CM | POA: Insufficient documentation

## 2021-02-14 DIAGNOSIS — Z8616 Personal history of COVID-19: Secondary | ICD-10-CM | POA: Insufficient documentation

## 2021-02-14 DIAGNOSIS — R0602 Shortness of breath: Secondary | ICD-10-CM

## 2021-02-14 DIAGNOSIS — F17200 Nicotine dependence, unspecified, uncomplicated: Secondary | ICD-10-CM | POA: Insufficient documentation

## 2021-02-14 NOTE — ED Triage Notes (Signed)
Patient reports SOB with lighthededness and occasional dry cough onset last week .

## 2021-02-14 NOTE — ED Provider Triage Note (Signed)
Emergency Medicine Provider Triage Evaluation Note  Niasha A Litchford , a 51 y.o. female  was evaluated in triage.  Pt complains of feeling like she can't get a deep breath  Review of Systems  Positive: cough Negative: fever  Physical Exam  BP 139/89 (BP Location: Right Arm)    Pulse 86    Temp 98.2 F (36.8 C) (Oral)    Resp 15    Ht 5\' 3"  (1.6 m)    Wt 55 kg    SpO2 96%    BMI 21.48 kg/m  Gen:   Awake, no distress   Resp:  Normal effort  MSK:   Moves extremities without difficulty  Other:    Medical Decision Making  Medically screening exam initiated at 9:37 PM.  Appropriate orders placed.  Memori A Thomure was informed that the remainder of the evaluation will be completed by another provider, this initial triage assessment does not replace that evaluation, and the importance of remaining in the ED until their evaluation is complete.     , Elson Areas 02/14/21 [id number]

## 2021-02-15 MED ORDER — ALBUTEROL SULFATE HFA 108 (90 BASE) MCG/ACT IN AERS: 1.0000 | INHALATION_SPRAY | Freq: Four times a day (QID) | RESPIRATORY_TRACT | 1 refills | Status: AC | PRN

## 2021-02-15 NOTE — ED Provider Notes (Signed)
[facility] EMERGENCY DEPARTMENT Provider Note   CSN: [id number] Arrival date & time: 02/14/21  2107     History  Chief Complaint  Patient presents with   Shortness of Breath    Judith Wu is a 51 y.o. female Patient with history of COPD, tobacco use, recent COVID infection last week who presents with feeling of shortness of breath x1 night.  Patient reports that she was sitting on the couch when she felt like she was getting enough air, she has had occasional headaches.  Patient also concerned about possible apneic episode because she woke up from sleep with a headache, feeling like she was not getting enough air.  Patient does not have an inhaler, is still actively smoking tobacco.  Patient denies ongoing fever, does endorse a little bit of nausea versus lack of appetite secondary to loss of taste and smell.  Denies chest pain.    Shortness of Breath     Home Medications Prior to Admission medications   Medication Sig Start Date End Date Taking? Authorizing Provider  albuterol (VENTOLIN HFA) 108 (90 Base) MCG/ACT inhaler Inhale 1-2 puffs into the lungs every 6 (six) hours as needed for wheezing or shortness of breath. 02/15/21  Yes Quatisha Zylka H, PA-C  doxycycline (VIBRAMYCIN) 100 MG capsule Take 1 capsule (100 mg total) by mouth 2 (two) times daily. 03/19/16   Everlene Farrier, PA-C  escitalopram (LEXAPRO) 20 MG tablet Take 1 tablet (20 mg total) by mouth daily. 06/18/15   Pete Glatter, MD  ibuprofen (ADVIL,MOTRIN) 200 MG tablet Take 600 mg by mouth every 6 (six) hours as needed for pain.    [provider]  LORazepam (ATIVAN) 1 MG tablet Take 1 tablet (1 mg total) by mouth 3 (three) times daily as needed for anxiety. 09/11/12   Rolland Porter, MD  nitrofurantoin (MACRODANTIN) 50 MG capsule Take 50 mg by mouth every other day. Reported on 06/18/2015    [provider]      Allergies    Sulfa antibiotics    Review of Systems    Review of Systems  Respiratory:  Positive for shortness of breath.   All other systems reviewed and are negative.  Physical Exam Updated Vital Signs BP 125/83    Pulse 63    Temp 97.8 F (36.6 C) (Oral)    Resp 18    Ht 5\' 3"  (1.6 m)    Wt 55 kg    SpO2 96%    BMI 21.48 kg/m  Physical Exam Vitals and nursing note reviewed.  Constitutional:      General: She is not in acute distress.    Appearance: Normal appearance.  HENT:     Head: Normocephalic and atraumatic.  Eyes:     General:        Right eye: No discharge.        Left eye: No discharge.  Cardiovascular:     Rate and Rhythm: Normal rate and regular rhythm.     Heart sounds: No murmur heard.   No friction rub. No gallop.  Pulmonary:     Effort: Pulmonary effort is normal.     Breath sounds: Normal breath sounds.     Comments: Minimal end expiratory wheezing on forced exhale Abdominal:     General: Bowel sounds are normal.     Palpations: Abdomen is soft.  Musculoskeletal:     Right lower leg: No tenderness. No edema.     Left lower leg: No  tenderness. No edema.  Skin:    General: Skin is warm and dry.     Capillary Refill: Capillary refill takes less than 2 seconds.  Neurological:     Mental Status: She is alert and oriented to person, place, and time.  Psychiatric:        Mood and Affect: Mood normal.        Behavior: Behavior normal.    ED Results / Procedures / Treatments   Labs (all labs ordered are listed, but only abnormal results are displayed) Labs Reviewed - No data to display  EKG None  Radiology DG Chest 2 View  Result Date: 02/14/2021 CLINICAL DATA:  Shortness of breath, cough EXAM: CHEST - 2 VIEW COMPARISON:  06/11/2015 FINDINGS: There is hyperinflation of the lungs compatible with COPD. Heart and mediastinal contours are within normal limits. No focal opacities or effusions. No acute bony abnormality. IMPRESSION: COPD.  No active disease. Electronically Signed   By: Charlett Nose M.D.   On:  02/14/2021 22:14    Procedures Procedures    Medications Ordered in ED Medications - No data to display  ED Course/ Medical Decision Making/ A&P                            Medical Decision Making Amount and/or Complexity of Data Reviewed Radiology: ordered.  Risk Prescription drug management.   This is an overall well-appearing patient who reports some shortness of breath, lightheadedness, occasional dry cough since last week.  Patient recently with COVID diagnosis, still getting over some of her infectious symptoms.  She is denying any active fever, chills, does endorse minimal nausea, lack of appetite, no vomiting, no diarrhea.  No chest pain at this time.  My differential diagnosis includes sequelae secondary to COVID, COPD exacerbation, acute bronchitis, pneumonia versus other.  This is not an exhaustive differential.  Minimal clinical concern for pulmonary embolism at this time as only risk factor is hypercoagulable state secondary to COVID infection, tobacco use, however patient has no tachycardia, no evidence of DVT.  Minimal end expiratory wheezing on physical exam.  Normal heart sounds, good air movement throughout.  No signs of cyanosis, clubbing.  I personally reviewed the chest x-ray which shows COPD changes, no other acute intrathoracic abnormality.  I agree with the radiologist interpretation.  Overall well-appearing patient with minimal end expiratory wheezing, history of COPD.  Believe the patient would benefit from having a rescue inhaler at this time.  She is maintaining stable vitals, stable oxygenation saturation, good air movement through both lungs throughout the duration of her visit.  Additional history obtained from her partner.  Encouraged follow-up with PCP.  Patient understands and agrees to plan, discharged in stable condition at this time. Final Clinical Impression(s) / ED Diagnoses Final diagnoses:  Shortness of breath    Rx / DC Orders ED Discharge  Orders          Ordered    albuterol (VENTOLIN HFA) 108 (90 Base) MCG/ACT inhaler  Every 6 hours PRN        02/15/21 0744              Letita Prentiss, Harrel Carina, PA-C 02/15/21 0805    Benjiman Core, MD 02/16/21 [phone]

## 2021-02-15 NOTE — Discharge Instructions (Signed)
As we discussed your symptoms today are consistent with post respiratory infection inflammation of your lungs, and COPD changes.  I think that you would benefit from a rescue inhaler.  You can use the rescue inhaler as needed when you feel short of breath, shake to prime, inhale into the lungs, and hold a deep breath, limit use to every 4-6 hours.  I recommend that you follow-up with your primary care to ensure that you are improving.

## 2021-09-24 ENCOUNTER — Other Ambulatory Visit: Payer: Self-pay | Admitting: Physician Assistant

## 2021-09-24 DIAGNOSIS — Z1231 Encounter for screening mammogram for malignant neoplasm of breast: Secondary | ICD-10-CM

## 2021-09-26 ENCOUNTER — Other Ambulatory Visit: Payer: Self-pay | Admitting: Physician Assistant

## 2021-09-26 DIAGNOSIS — Z122 Encounter for screening for malignant neoplasm of respiratory organs: Secondary | ICD-10-CM

## 2021-09-26 DIAGNOSIS — F17200 Nicotine dependence, unspecified, uncomplicated: Secondary | ICD-10-CM

## 2021-09-30 ENCOUNTER — Ambulatory Visit
Admission: RE | Admit: 2021-09-30 | Discharge: 2021-09-30 | Disposition: A | Discharge status: Home / Self Care | Payer: No Typology Code available for payment source | Source: Ambulatory Visit | Attending: Physician Assistant | Admitting: Physician Assistant

## 2021-09-30 DIAGNOSIS — Z1231 Encounter for screening mammogram for malignant neoplasm of breast: Secondary | ICD-10-CM

## 2021-10-23 ENCOUNTER — Ambulatory Visit
Admission: RE | Admit: 2021-10-23 | Discharge: 2021-10-23 | Disposition: A | Discharge status: Home / Self Care | Payer: No Typology Code available for payment source | Source: Ambulatory Visit | Attending: Physician Assistant | Admitting: Physician Assistant

## 2021-10-23 DIAGNOSIS — Z122 Encounter for screening for malignant neoplasm of respiratory organs: Secondary | ICD-10-CM

## 2021-10-23 DIAGNOSIS — F17200 Nicotine dependence, unspecified, uncomplicated: Secondary | ICD-10-CM

## 2022-04-18 IMAGING — US US BREAST*L* LIMITED INC AXILLA
1 series · 5 of 5 positions shown · non-contrast
Comparison: Baseline screening mammogram dated 08/19/2020.

CLINICAL DATA: Possible mass in the medial left breast on a recent
baseline screening mammogram.

EXAM:
DIGITAL DIAGNOSTIC UNILATERAL LEFT MAMMOGRAM WITH TOMOSYNTHESIS AND
CAD; ULTRASOUND LEFT BREAST LIMITED
TECHNIQUE: Left digital diagnostic mammography and breast tomosynthesis was
performed. The images were evaluated with computer-aided detection.;
Targeted ultrasound examination of the left breast was performed.

[Series 1: us breast*left* limited inc axilla · 0.05mm/px · 5 of 5 slices shown]
[im 1/5]
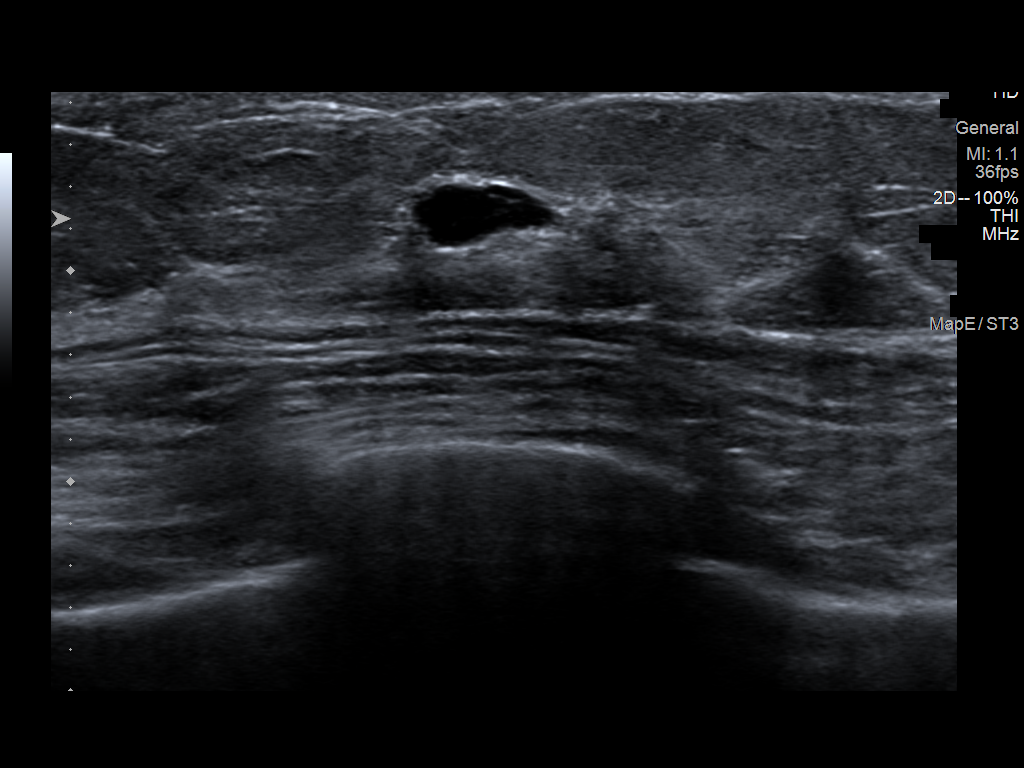
[im 2/5]
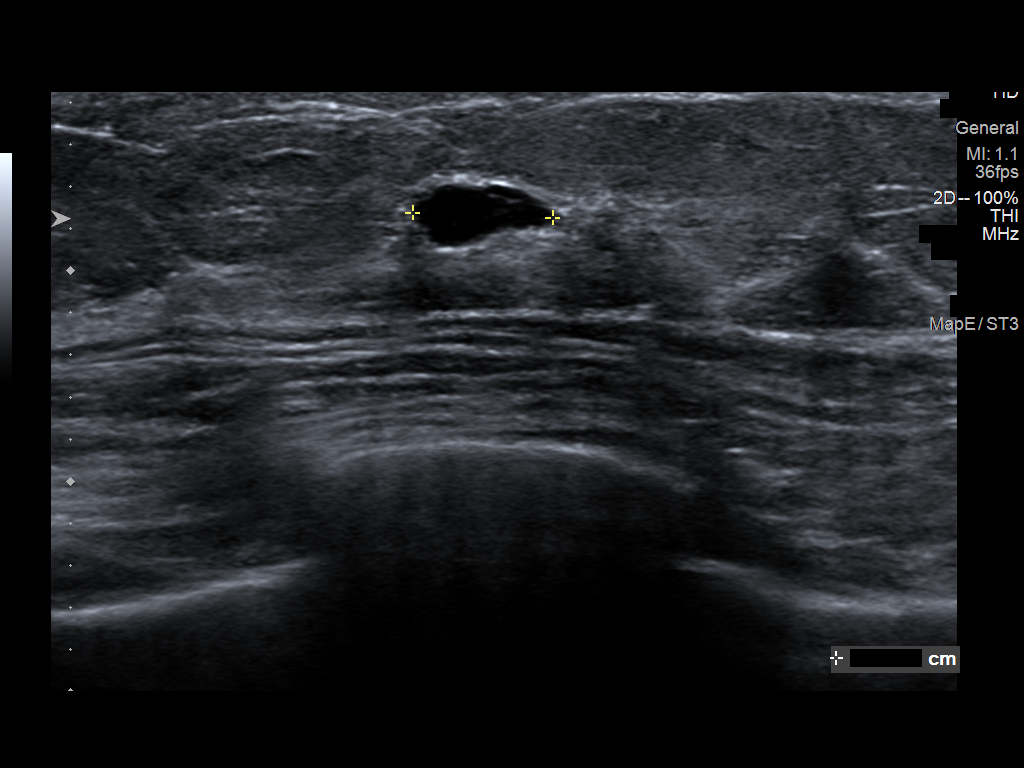
[im 3/5]
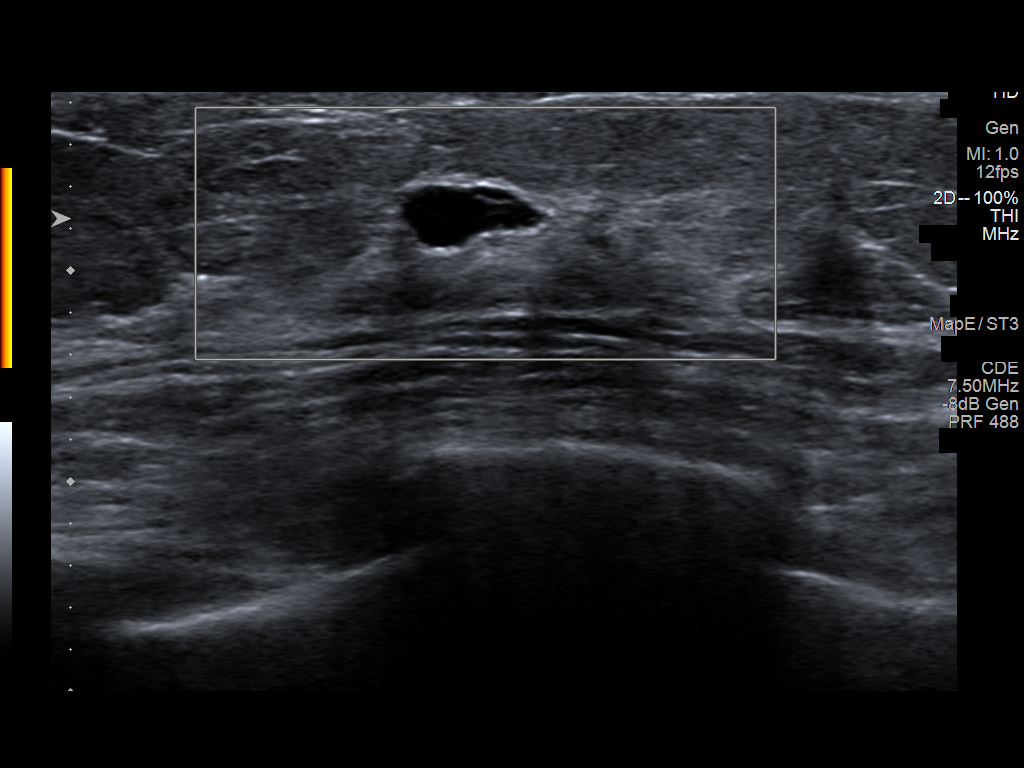
[im 4/5]
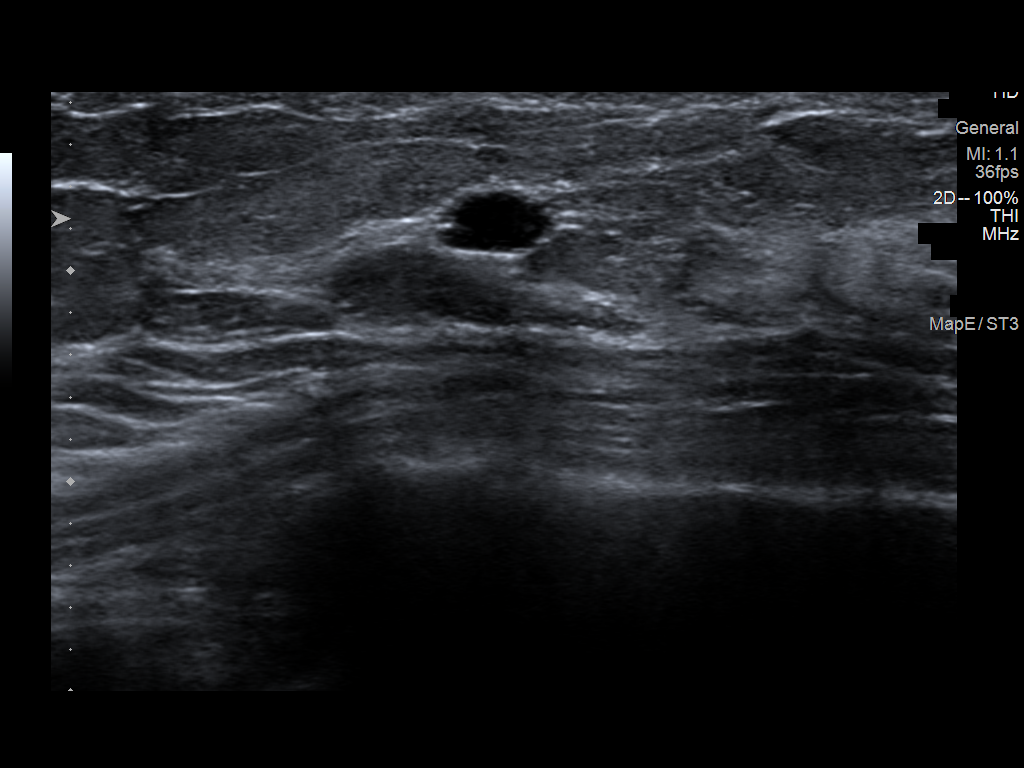
[im 5/5]
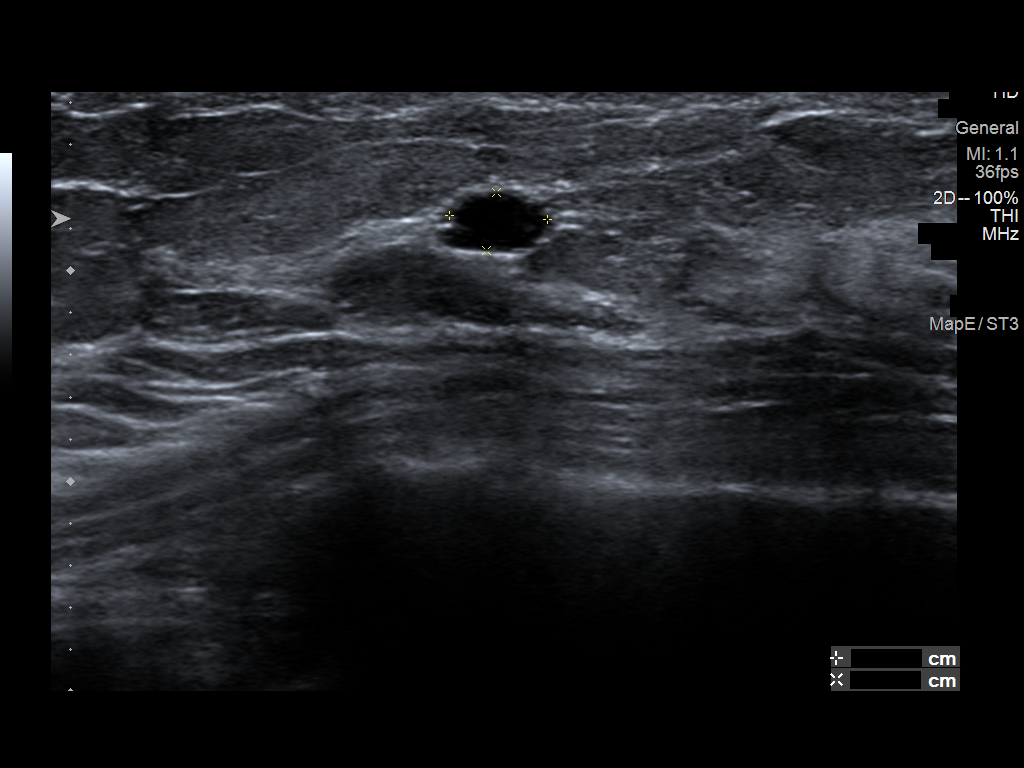

[5 of 5 positions shown; findings below may reference images not displayed]

ACR Breast Density Category b: There are scattered areas of
fibroglandular density.
FINDINGS: 3D tomographic and 2D generated spot compression images of the left
breast confirm an oval, mass-like density with some circumscribed
and some indistinct or obscured margins in the medial aspect of the
breast in the middle 3rd.

Targeted ultrasound is performed, showing a 7 mm cyst containing a
thin partial internal septation and minimal internal echoes in the
10 o'clock position of the left breast, 3 cm from the nipple. No
internal blood flow was seen with power Doppler. This corresponds to
the mammographic mass.
IMPRESSION: Benign left breast cyst.  No evidence of malignancy.

RECOMMENDATION:
Bilateral screening mammogram in 11 months when due.

I have discussed the findings and recommendations with the patient.
If applicable, a reminder letter will be sent to the patient
regarding the next appointment.

BI-RADS CATEGORY  2: Benign.

## 2022-04-18 IMAGING — MG MM DIGITAL DIAGNOSTIC UNILAT*L* W/ TOMO W/ CAD
4 series · 4 of 12 positions shown · non-contrast
Comparison: Baseline screening mammogram dated 08/19/2020.

CLINICAL DATA: Possible mass in the medial left breast on a recent
baseline screening mammogram.

EXAM:
DIGITAL DIAGNOSTIC UNILATERAL LEFT MAMMOGRAM WITH TOMOSYNTHESIS AND
CAD; ULTRASOUND LEFT BREAST LIMITED
TECHNIQUE: Left digital diagnostic mammography and breast tomosynthesis was
performed. The images were evaluated with computer-aided detection.;
Targeted ultrasound examination of the left breast was performed.

[L CC synth-2D]
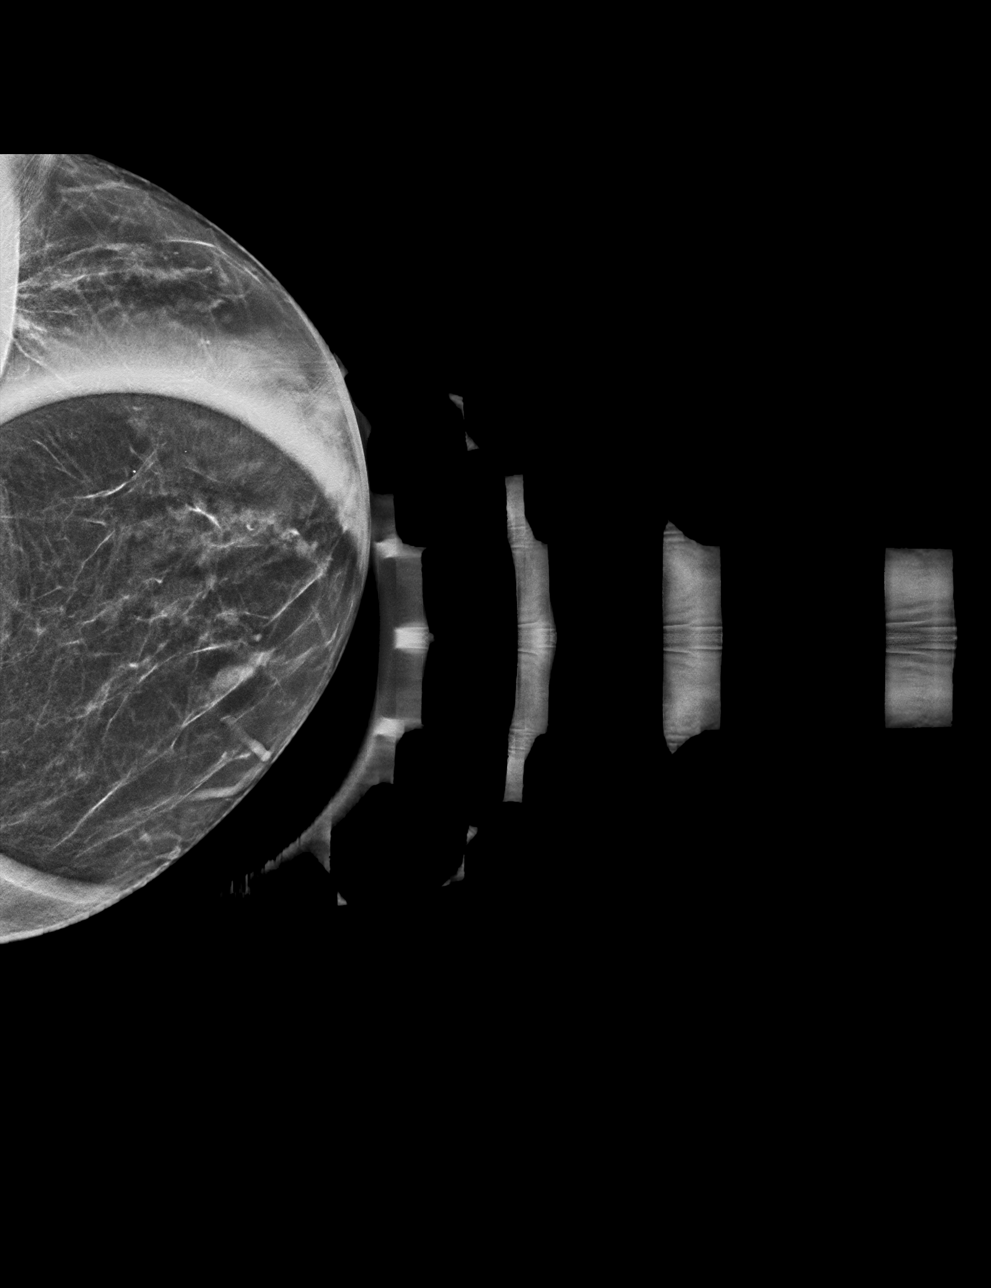

[L MLO synth-2D]
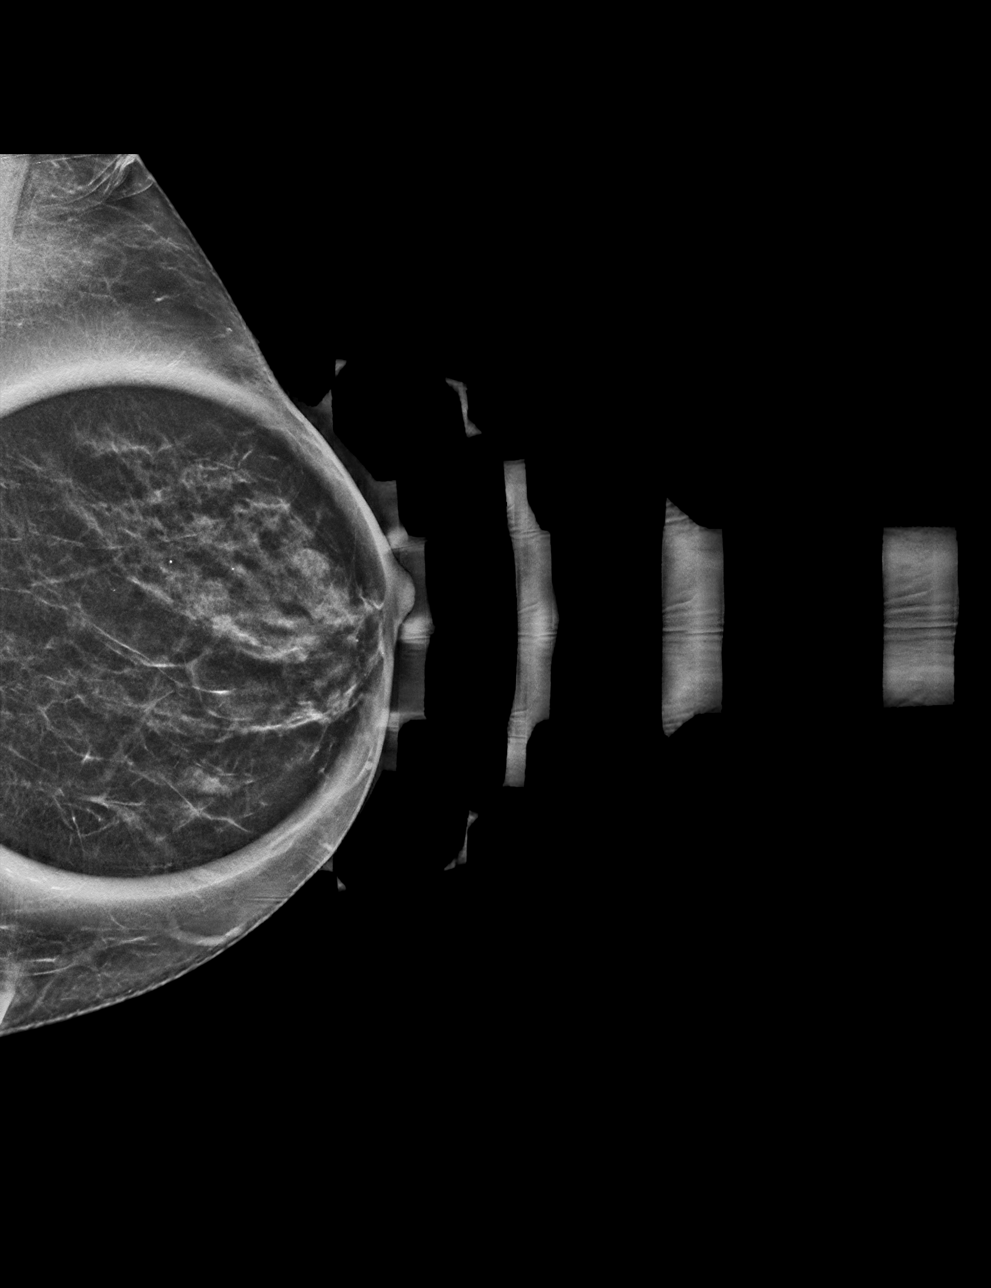

[L CC tomo · tomo slice 23/45.0]
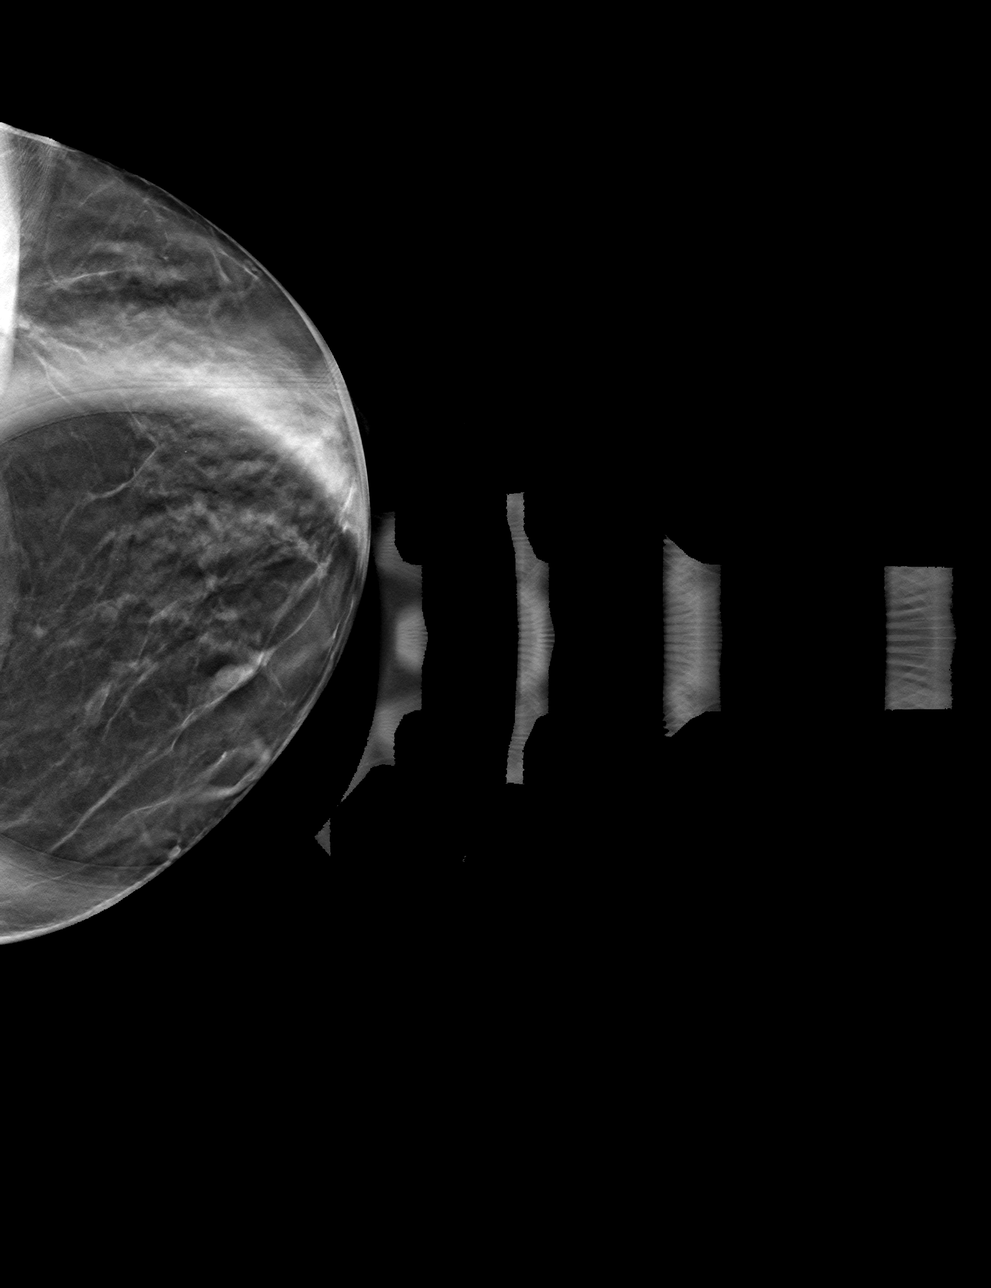

[L MLO tomo · tomo slice 22/43.0]
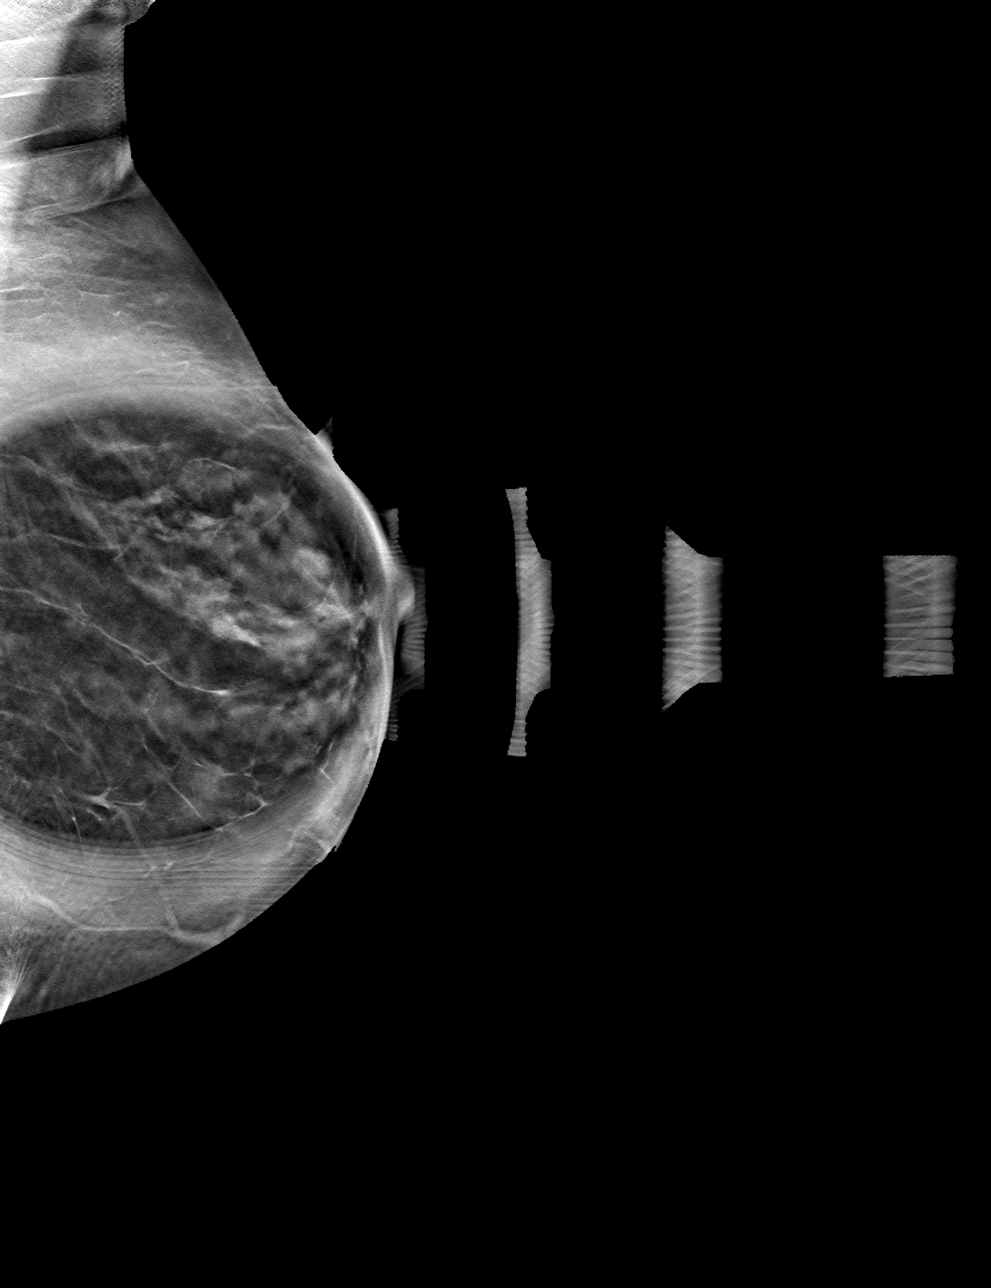

[4 of 12 positions shown; findings below may reference images not displayed]

ACR Breast Density Category b: There are scattered areas of
fibroglandular density.
FINDINGS: 3D tomographic and 2D generated spot compression images of the left
breast confirm an oval, mass-like density with some circumscribed
and some indistinct or obscured margins in the medial aspect of the
breast in the middle 3rd.

Targeted ultrasound is performed, showing a 7 mm cyst containing a
thin partial internal septation and minimal internal echoes in the
10 o'clock position of the left breast, 3 cm from the nipple. No
internal blood flow was seen with power Doppler. This corresponds to
the mammographic mass.
IMPRESSION: Benign left breast cyst.  No evidence of malignancy.

RECOMMENDATION:
Bilateral screening mammogram in 11 months when due.

I have discussed the findings and recommendations with the patient.
If applicable, a reminder letter will be sent to the patient
regarding the next appointment.

BI-RADS CATEGORY  2: Benign.
